# Patient Record
Sex: Female | Born: 1945 | Hispanic: Yes | Marital: Married | Smoking: Never smoker
Health system: Southern US, Community
[De-identification: ages and names within clinical notes are randomized; demographics above are authoritative.]

## PROBLEM LIST (undated history)

## (undated) DIAGNOSIS — R7302 Impaired glucose tolerance (oral): Secondary | ICD-10-CM

## (undated) DIAGNOSIS — R059 Cough, unspecified: Secondary | ICD-10-CM

## (undated) DIAGNOSIS — R413 Other amnesia: Secondary | ICD-10-CM

## (undated) DIAGNOSIS — M810 Age-related osteoporosis without current pathological fracture: Secondary | ICD-10-CM

## (undated) DIAGNOSIS — Z9889 Other specified postprocedural states: Secondary | ICD-10-CM

## (undated) DIAGNOSIS — E785 Hyperlipidemia, unspecified: Secondary | ICD-10-CM

## (undated) DIAGNOSIS — M6530 Trigger finger, unspecified finger: Secondary | ICD-10-CM

## (undated) DIAGNOSIS — I10 Essential (primary) hypertension: Secondary | ICD-10-CM

## (undated) HISTORY — DX: Trigger finger, unspecified finger: M65.30

## (undated) HISTORY — DX: Impaired glucose tolerance (oral): R73.02

## (undated) HISTORY — DX: Hyperlipidemia, unspecified: E78.5

## (undated) HISTORY — DX: Cough, unspecified: R05.9

## (undated) HISTORY — DX: Other amnesia: R41.3

## (undated) HISTORY — PX: HAND SURGERY: SHX662

## (undated) HISTORY — PX: HIP SURGERY: SHX245

## (undated) HISTORY — PX: KNEE SURGERY: SHX244

## (undated) HISTORY — DX: Age-related osteoporosis without current pathological fracture: M81.0

---

## 2018-02-27 ENCOUNTER — Ambulatory Visit
Admission: RE | Admit: 2018-02-27 | Discharge: 2018-02-27 | Disposition: A | Discharge status: Home / Self Care | Payer: No Typology Code available for payment source | Source: Ambulatory Visit | Attending: Family Medicine | Admitting: Family Medicine

## 2018-02-27 ENCOUNTER — Other Ambulatory Visit: Payer: Self-pay | Admitting: Family Medicine

## 2018-02-27 DIAGNOSIS — M25552 Pain in left hip: Secondary | ICD-10-CM

## 2018-03-06 ENCOUNTER — Emergency Department (HOSPITAL_COMMUNITY): Payer: No Typology Code available for payment source

## 2018-03-06 ENCOUNTER — Emergency Department (HOSPITAL_COMMUNITY)
Admission: EM | Admit: 2018-03-06 | Discharge: 2018-03-06 | Disposition: A | Discharge status: Home / Self Care | Payer: No Typology Code available for payment source | Attending: Emergency Medicine | Admitting: Emergency Medicine

## 2018-03-06 ENCOUNTER — Encounter (HOSPITAL_COMMUNITY): Payer: Self-pay

## 2018-03-06 ENCOUNTER — Other Ambulatory Visit: Payer: Self-pay

## 2018-03-06 ENCOUNTER — Other Ambulatory Visit: Payer: Self-pay | Admitting: Family Medicine

## 2018-03-06 ENCOUNTER — Ambulatory Visit
Admission: RE | Admit: 2018-03-06 | Discharge: 2018-03-06 | Disposition: A | Discharge status: Home / Self Care | Payer: No Typology Code available for payment source | Source: Ambulatory Visit | Attending: Family Medicine | Admitting: Family Medicine

## 2018-03-06 DIAGNOSIS — X58XXXA Exposure to other specified factors, initial encounter: Secondary | ICD-10-CM | POA: Insufficient documentation

## 2018-03-06 DIAGNOSIS — Y929 Unspecified place or not applicable: Secondary | ICD-10-CM | POA: Insufficient documentation

## 2018-03-06 DIAGNOSIS — M129 Arthropathy, unspecified: Secondary | ICD-10-CM

## 2018-03-06 DIAGNOSIS — I10 Essential (primary) hypertension: Secondary | ICD-10-CM | POA: Insufficient documentation

## 2018-03-06 DIAGNOSIS — Y999 Unspecified external cause status: Secondary | ICD-10-CM | POA: Insufficient documentation

## 2018-03-06 DIAGNOSIS — Y939 Activity, unspecified: Secondary | ICD-10-CM | POA: Insufficient documentation

## 2018-03-06 DIAGNOSIS — S73002A Unspecified subluxation of left hip, initial encounter: Secondary | ICD-10-CM | POA: Insufficient documentation

## 2018-03-06 DIAGNOSIS — T148XXA Other injury of unspecified body region, initial encounter: Secondary | ICD-10-CM

## 2018-03-06 HISTORY — DX: Essential (primary) hypertension: I10

## 2018-03-06 HISTORY — DX: Other specified postprocedural states: Z98.890

## 2018-03-06 LAB — CBC WITH DIFFERENTIAL/PLATELET
Abs Immature Granulocytes: 0.04 10*3/uL (ref 0.00–0.07)
Basophils Absolute: 0 10*3/uL (ref 0.0–0.1)
Basophils Relative: 0 %
EOS PCT: 1 %
Eosinophils Absolute: 0.1 10*3/uL (ref 0.0–0.5)
HCT: 42.5 % (ref 36.0–46.0)
Hemoglobin: 13.4 g/dL (ref 12.0–15.0)
Immature Granulocytes: 1 %
LYMPHS ABS: 3.5 10*3/uL (ref 0.7–4.0)
Lymphocytes Relative: 44 %
MCH: 26 pg (ref 26.0–34.0)
MCHC: 31.5 g/dL (ref 30.0–36.0)
MCV: 82.4 fL (ref 80.0–100.0)
Monocytes Absolute: 0.6 10*3/uL (ref 0.1–1.0)
Monocytes Relative: 8 %
NRBC: 0 % (ref 0.0–0.2)
Neutro Abs: 3.6 10*3/uL (ref 1.7–7.7)
Neutrophils Relative %: 46 %
Platelets: 325 10*3/uL (ref 150–400)
RBC: 5.16 MIL/uL — ABNORMAL HIGH (ref 3.87–5.11)
RDW: 15.5 % (ref 11.5–15.5)
WBC: 7.9 10*3/uL (ref 4.0–10.5)

## 2018-03-06 LAB — BASIC METABOLIC PANEL
Anion gap: 11 (ref 5–15)
BUN: 22 mg/dL (ref 8–23)
CO2: 25 mmol/L (ref 22–32)
Calcium: 8.9 mg/dL (ref 8.9–10.3)
Chloride: 101 mmol/L (ref 98–111)
Creatinine, Ser: 0.73 mg/dL (ref 0.44–1.00)
GFR calc Af Amer: >60 mL/min (ref >60)
GFR calc non Af Amer: >60 mL/min (ref >60)
Glucose, Bld: 93 mg/dL (ref 70–99)
Potassium: 3.9 mmol/L (ref 3.5–5.1)
Sodium: 137 mmol/L (ref 135–145)

## 2018-03-06 MED ORDER — PROPOFOL 10 MG/ML IV BOLUS
INTRAVENOUS | Status: AC | PRN
  Administered 2018-03-06: 50 mg via INTRAVENOUS

## 2018-03-06 MED ORDER — PROPOFOL 10 MG/ML IV BOLUS
INTRAVENOUS | Status: AC | PRN
  Administered 2018-03-06: 30 mg via INTRAVENOUS

## 2018-03-06 MED ORDER — HYDROMORPHONE HCL 1 MG/ML IJ SOLN
1.0000 mg | Freq: Once | INTRAMUSCULAR | Status: AC
  Administered 2018-03-06: 1 mg via INTRAVENOUS
  Filled 2018-03-06: qty 1

## 2018-03-06 MED ORDER — PROPOFOL 10 MG/ML IV BOLUS
INTRAVENOUS | Status: AC | PRN
  Administered 2018-03-06: 70 mg via INTRAVENOUS

## 2018-03-06 MED ORDER — PROPOFOL 10 MG/ML IV BOLUS
200.0000 mg | Freq: Once | INTRAVENOUS | Status: DC
  Filled 2018-03-06: qty 20

## 2018-03-06 NOTE — Discharge Instructions (Signed)
Continue your pain meds as prescribed by your doctor.   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 Please call [facility] today to schedule appointment. You likely will need revision surgery   Return to ER if you have worse hip pain, weakness, numbness

## 2018-03-06 NOTE — ED Notes (Addendum)
30 mg IV propofol administered by Dr Silverio LayYao

## 2018-03-06 NOTE — ED Triage Notes (Signed)
Pt is accompanied by son in law and husband. Pt coming from PCP, with imaging indicating dislocated left hip. Pt denies injury or fall. Pt has been doing a "treatment for a month" without improvement in pain. Pt is Spanish speaking, interpreter used [place] [phone]

## 2018-03-06 NOTE — Sedation Documentation (Signed)
Patient oxygen saturations dropped. Patient oxygenated using BMV and jaw thrust

## 2018-03-06 NOTE — ED Notes (Signed)
Per RN at PCP-patient had her [place] hip [place] and right hip replaced in [place]-states had imaging done on left hip which shows it is dislocated-states it needs surgery

## 2018-03-06 NOTE — Sedation Documentation (Signed)
X-ray at bedside

## 2018-03-06 NOTE — Sedation Documentation (Signed)
Reduction was not successful, per xray. Dr. Silverio LayYao to call orthopedic surgeon for consult. Patient and family updated on POC

## 2018-03-06 NOTE — ED Notes (Signed)
ED TO INPATIENT HANDOFF REPORT  Name/Age/Gender Raven Curry 72 y.o. female  Code Status   Home/SNF/Other Home  Chief Complaint left hip dislocation  Level of Care/Admitting Diagnosis ED Disposition    ED Disposition Condition Comment   Discharge  The patient appears reasonably screened and/or stabilized for discharge and I doubt any other medical condition or other [facility] requiring further screening, evaluation, or treatment in the ED exists or is present at this time prior to discharge.       Medical History Past Medical History:  Diagnosis Date  . History of hip surgery   . Hypertension     Allergies No Known Allergies  IV Location/Drains/Wounds Patient Lines/Drains/Airways Status   Active Line/Drains/Airways    None          Labs/Imaging Results for orders placed or performed during the hospital encounter of 03/06/18 (from the past 48 hour(s))  CBC with Differential/Platelet     Status: Abnormal   Collection Time: 03/06/18 12:19 PM  Result Value Ref Range   WBC 7.9 4.0 - 10.5 K/uL   RBC 5.16 (H) 3.87 - 5.11 MIL/uL   Hemoglobin 13.4 12.0 - 15.0 g/dL   HCT 45.4 09.8 - 11.9 %   MCV 82.4 80.0 - 100.0 fL   MCH 26.0 26.0 - 34.0 pg   MCHC 31.5 30.0 - 36.0 g/dL   RDW 14.7 82.9 - 56.2 %   Platelets 325 150 - 400 K/uL   nRBC 0.0 0.0 - 0.2 %   Neutrophils Relative % 46 %   Neutro Abs 3.6 1.7 - 7.7 K/uL   Lymphocytes Relative 44 %   Lymphs Abs 3.5 0.7 - 4.0 K/uL   Monocytes Relative 8 %   Monocytes Absolute 0.6 0.1 - 1.0 K/uL   Eosinophils Relative 1 %   Eosinophils Absolute 0.1 0.0 - 0.5 K/uL   Basophils Relative 0 %   Basophils Absolute 0.0 0.0 - 0.1 K/uL   Immature Granulocytes 1 %   Abs Immature Granulocytes 0.04 0.00 - 0.07 K/uL    Comment: Performed at [facility], [address], [place], [place] [address]  Basic metabolic panel     Status: None   Collection Time: 03/06/18 12:19 PM  Result Value Ref Range   Sodium 137  135 - 145 mmol/L   Potassium 3.9 3.5 - 5.1 mmol/L   Chloride 101 98 - 111 mmol/L   CO2 25 22 - 32 mmol/L   Glucose, Bld 93 70 - 99 mg/dL   BUN 22 8 - 23 mg/dL   Creatinine, Ser 5.78 0.44 - 1.00 mg/dL   Calcium 8.9 8.9 - 46.9 mg/dL   GFR calc non Af Amer >60 >60 mL/min   GFR calc Af Amer >60 >60 mL/min   Anion gap 11 5 - 15    Comment: Performed at [facility], [address], [place], [place] [address]   Ct Hip Left Wo Contrast  Result Date: 03/06/2018 CLINICAL DATA:  Possible dislocated left hip replacement. Left hip pain. The patient denies injury. EXAM: CT OF THE LEFT HIP WITHOUT CONTRAST TECHNIQUE: Multidetector CT imaging of the left hip was performed according to the standard protocol. Multiplanar CT image reconstructions were also generated. COMPARISON:  Plain films left hip earlier today and 02/27/2018. 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 The device is not dislocated. There is extensive erosive change in the left acetabulum. The medial wall of the acetabulum is markedly thinned and there  is extensive lucency at the bone-cement interface of the acetabular component. The superior aspect of the acetabular cup is completely eroded and the patient's left femoral head is superiorly and laterally subluxed. The anterior 70% of the greater trochanter is also eroded. No fracture is identified. Ligaments Suboptimally assessed by CT. Muscles and Tendons Mild-to-moderate fatty atrophy of the left gluteus minimus and maximus is present. 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 Multiple tiny bone fragments are identified in this lesion. There is fluid in the left trochanteric bursa. IMPRESSION: Negative for hip dislocation. 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 The femoral head is superiorly displaced and demonstrates mild lateral subluxation due to erosion of both the left acetabulum and the acetabular cup. Fluid in the left trochanteric bursa and pseudotumor in the site of erosion of the greater trochanter noted. Electronically Signed   By: Drusilla Kannerhomas  Dalessio M.D.   On: 03/06/2018 14:18   Dg Hip Unilat With Pelvis 2-3 Views Left  Result Date: 03/06/2018 CLINICAL DATA:  Post reduction EXAM: DG HIP (WITH OR WITHOUT PELVIS) 2-3V LEFT COMPARISON:  03/06/2018 FINDINGS: Unchanged abnormal alignment of the left hip, although not definitely dislocated, with location unchanged from prior 2 studies. The high femoral head positioning may reflect acetabular prosthesis wear with superior migration. No periprosthetic fracture is seen. Osteopenia. IMPRESSION: Unchanged abnormal left hip prosthesis alignment, although not clearly dislocated. Positioning may be related to failure of the acetabular component with femoral head migration or pseudoarthrosis. Consider CT characterization if there are no outside comparisons. Electronically Signed   By: Marnee SpringJonathon  Watts M.D.   On: 03/06/2018 13:05   Dg Hip Unilat With Pelvis 2-3 Views Left  Result Date: 03/06/2018 CLINICAL DATA:  Acute left hip pain EXAM: DG HIP (WITH OR WITHOUT PELVIS) 2-3V LEFT COMPARISON:  02/27/2018 FINDINGS: The left hip prosthesis is again dislocated superiorly. These changes are similar to that seen on the prior examination. IMPRESSION: Superior dislocation of the femoral prosthesis with respect to the acetabulum. Electronically Signed   By: Alcide CleverMark  Lukens M.D.   On: 03/06/2018 11:04   None  Pending Labs Unresulted Labs (From admission, onward)   None      Vitals/Pain Today's Vitals   03/06/18 1445 03/06/18 1512 03/06/18 1520 03/06/18 1522  BP: 127/71 (!) 152/83  (!) 152/83  Pulse: 68 64  64  Resp: (!) 21 13  13   Temp:    98.4 F (36.9 C)  TempSrc:    Oral  SpO2:  90% 98%  98%  Weight:      Height:      PainSc:   0-No pain     Isolation Precautions No active isolations  Medications Medications  propofol (DIPRIVAN) 10 mg/mL bolus/IV push 200 mg (150 mg Intravenous See Procedure Record 03/06/18 1455)  HYDROmorphone (DILAUDID) injection 1 mg (1 mg Intravenous Given 03/06/18 1209)  propofol (DIPRIVAN) 10 mg/mL bolus/IV push (50 mg Intravenous Given 03/06/18 1232)  propofol (DIPRIVAN) 10 mg/mL bolus/IV push (30 mg Intravenous Given 03/06/18 1233)  propofol (DIPRIVAN) 10 mg/mL bolus/IV push (70 mg Intravenous Given 03/06/18 1235)    Mobility walks with person assist

## 2018-03-06 NOTE — ED Notes (Signed)
Consents completed and signed using Video interpreter. Time out completed. Dr Silverio LayYao, RT Manus Gunningee, Kelsey PA, Ortho tech Jonny RuizJohn at bedside.

## 2018-03-06 NOTE — ED Provider Notes (Signed)
[facility]-EMERGENCY DEPT Provider Note   CSN: [id number] Arrival date & time: 03/06/18  1121     History   Chief Complaint Chief Complaint  Patient presents with  . Hip Pain    HPI Caryle ura yingling is a 72 y.o. female history of hypertension, previous hip surgery in [place] here presenting with left hip pain possible dislocation.  Patient states that she has been having left hip pain for the last several months.  Patient has been doing physical therapy for the last several months with no relief.  She apparently had an x-ray about a week ago that was read as normal.  Patient had increasing pain so had an x-ray today that showed a dislocation.  On my read, patient appeared to have possible dislocation the previous x-ray.  Patient new falls or injuries.  Patient states that she is wheelchair-bound for the last several months. Last meal was 8 am this morning.    The history is provided by the patient.    Past Medical History:  Diagnosis Date  . History of hip surgery   . Hypertension     There are no active problems to display for this patient.   Past Surgical History:  Procedure Laterality Date  . HAND SURGERY Right   . HIP SURGERY Bilateral   . KNEE SURGERY Bilateral      OB History   No obstetric history on file.      Home Medications    Prior to Admission medications   Not on File    Family History No family history on file.  Social History Social History   Tobacco Use  . Smoking status: Never Smoker  . Smokeless tobacco: Never Used  Substance Use Topics  . Alcohol use: Never    Frequency: Never  . Drug use: Never     Allergies   Patient has no known allergies.   Review of Systems Review of Systems  Musculoskeletal:       L hip pain   All other systems reviewed and are negative.    Physical Exam Updated Vital Signs BP (!) 128/59   Pulse 76   Temp 98.5 F (36.9 C) (Oral)   Resp 15   Ht 4' 10.66" (1.49 m)    Wt 68.8 kg   SpO2 97%   BMI 30.99 kg/m   Physical Exam Vitals signs and nursing note reviewed.  Constitutional:      Appearance: Normal appearance.  HENT:     Head: Normocephalic.     Nose: Nose normal.     Mouth/Throat:     Mouth: Mucous membranes are moist.  Eyes:     Pupils: Pupils are equal, round, and reactive to light.  Neck:     Musculoskeletal: Normal range of motion.  Cardiovascular:     Rate and Rhythm: Normal rate.  Pulmonary:     Effort: Pulmonary effort is normal.     Breath sounds: Normal breath sounds.  Abdominal:     General: Abdomen is flat.     Palpations: Abdomen is soft.  Musculoskeletal:     Comments: Dec ROM L hip, no obvious shortening. 2+ DP pulses, able to wiggle toes   Skin:    General: Skin is warm.     Capillary Refill: Capillary refill takes less than 2 seconds.  Neurological:     General: No focal deficit present.     Mental Status: She is alert.  Psychiatric:  Mood and Affect: Mood normal.      ED Treatments / Results  Labs (all labs ordered are listed, but only abnormal results are displayed) Labs Reviewed  CBC WITH DIFFERENTIAL/PLATELET - Abnormal; Notable for the following components:      Result Value   RBC 5.16 (*)    All other components within normal limits  BASIC METABOLIC PANEL    EKG None  Radiology Dg Hip Unilat With Pelvis 2-3 Views Left  Result Date: 03/06/2018 CLINICAL DATA:  Post reduction EXAM: DG HIP (WITH OR WITHOUT PELVIS) 2-3V LEFT COMPARISON:  03/06/2018 FINDINGS: Unchanged abnormal alignment of the left hip, although not definitely dislocated, with location unchanged from prior 2 studies. The high femoral head positioning may reflect acetabular prosthesis wear with superior migration. No periprosthetic fracture is seen. Osteopenia. IMPRESSION: Unchanged abnormal left hip prosthesis alignment, although not clearly dislocated. Positioning may be related to failure of the acetabular component with  femoral head migration or pseudoarthrosis. Consider CT characterization if there are no outside comparisons. Electronically Signed   By: Marnee SpringJonathon  Watts M.D.   On: 03/06/2018 13:05   Dg Hip Unilat With Pelvis 2-3 Views Left  Result Date: 03/06/2018 CLINICAL DATA:  Acute left hip pain EXAM: DG HIP (WITH OR WITHOUT PELVIS) 2-3V LEFT COMPARISON:  02/27/2018 FINDINGS: The left hip prosthesis is again dislocated superiorly. These changes are similar to that seen on the prior examination. IMPRESSION: Superior dislocation of the femoral prosthesis with respect to the acetabulum. Electronically Signed   By: Alcide CleverMark  Lukens M.D.   On: 03/06/2018 11:04    Procedures .Sedation Date/Time: 03/06/2018 1:18 PM Performed by: Charlynne PanderYao, Thuy Atilano Hsienta, MD Authorized by: Charlynne PanderYao, Yeslin Delio Hsienta, MD   Consent:    Consent obtained:  Verbal   Consent given by:  Patient   Risks discussed:  Allergic reaction, dysrhythmia, inadequate sedation, nausea, prolonged hypoxia resulting in organ damage, prolonged sedation necessitating reversal, respiratory compromise necessitating ventilatory assistance and intubation and vomiting   Alternatives discussed:  Analgesia without sedation, anxiolysis and regional anesthesia Universal protocol:    Procedure explained and questions answered to patient or proxy's satisfaction: yes     Relevant documents present and verified: yes     Test results available and properly labeled: yes     Imaging studies available: yes     Required blood products, implants, devices, and special equipment available: yes     Site/side marked: yes     Immediately prior to procedure a time out was called: yes     Patient identity confirmation method:  Verbally with patient Indications:    Procedure necessitating sedation performed by:  Physician performing sedation Pre-sedation assessment:    Time since last food or drink:  4 hours   ASA classification: class 1 - normal, healthy patient     Neck mobility:  normal     Mouth opening:  3 or more finger widths   Thyromental distance:  4 finger widths   Mallampati score:  I - soft palate, uvula, fauces, pillars visible   Pre-sedation assessments completed and reviewed: airway patency, cardiovascular function, hydration status, mental status, nausea/vomiting, pain level, respiratory function and temperature   Immediate pre-procedure details:    Reassessment: Patient reassessed immediately prior to procedure     Reviewed: vital signs, relevant labs/tests and NPO status     Verified: bag valve mask available, emergency equipment available, intubation equipment available, IV patency confirmed, oxygen available and suction available   Procedure details (see MAR for exact dosages):  Preoxygenation:  Nasal cannula   Sedation:  Propofol   Intra-procedure monitoring:  Blood pressure monitoring, cardiac monitor, continuous pulse oximetry, frequent LOC assessments, frequent vital sign checks and continuous capnometry   Intra-procedure events: none     Total Provider sedation time (minutes):  30 Post-procedure details:    Attendance: Constant attendance by certified staff until patient recovered     Recovery: Patient returned to pre-procedure baseline     Post-sedation assessments completed and reviewed: airway patency, cardiovascular function, hydration status, mental status, nausea/vomiting, pain level, respiratory function and temperature     Patient is stable for discharge or admission: yes     Patient tolerance:  Tolerated well, no immediate complications Reduction of dislocation Date/Time: 03/06/2018 1:19 PM Performed by: Charlynne Pander, MD Authorized by: Charlynne Pander, MD  Consent: Verbal consent obtained. Risks and benefits: risks, benefits and alternatives were discussed Consent given by: patient Patient understanding: patient states understanding of the procedure being performed Patient consent: the patient's understanding of the  procedure matches consent given Procedure consent: procedure consent matches procedure scheduled Relevant documents: relevant documents present and verified Test results: test results available and properly labeled Site marked: the operative site was marked Imaging studies: imaging studies available Patient identity confirmed: verbally with patient and arm band Time out: Immediately prior to procedure a "time out" was called to verify the correct patient, procedure, equipment, support staff and site/side marked as required. Local anesthesia used: no  Anesthesia: Local anesthesia used: no  Sedation: Patient sedated: yes Sedatives: propofol  Patient tolerance: Patient tolerated the procedure well with no immediate complications    (including critical care time)  Medications Ordered in ED Medications  propofol (DIPRIVAN) 10 mg/mL bolus/IV push 200 mg (has no administration in time range)  HYDROmorphone (DILAUDID) injection 1 mg (1 mg Intravenous Given 03/06/18 1209)  propofol (DIPRIVAN) 10 mg/mL bolus/IV push (50 mcg Intravenous Given 03/06/18 1232)  propofol (DIPRIVAN) 10 mg/mL bolus/IV push (30 mcg Intravenous Given 03/06/18 1233)  propofol (DIPRIVAN) 10 mg/mL bolus/IV push (70 mcg Intravenous Given 03/06/18 1235)     Initial Impression / Assessment and Plan / ED Course  I have reviewed the triage vital signs and the nursing notes.  Pertinent labs & imaging results that were available during my care of the patient were reviewed by me and considered in my medical decision making (see chart for details).    Kimberla micheala morissette is a 72 y.o. female here with L hip pain. Outpatient xray showed possible dislocation. I am concerned that she may have dislocated for months. I talked to Dr. Susa Simmonds from ortho, who recommend bedside conscious sedation and attempt at reduction. He will look at the xrays.   1 pm I attempted to reduce the dislocation but was unsuccessful. Patient briefly  became hypoxic and hypotensive and was give IVF and bagged briefly and now awake. I called Dr. Susa Simmonds again, who states that she may have hardware that was eroding through the acetabulum. Recommend noncontrast CT hip and follow up for hip revision.   3:16 PM Xray showed no hip dislocation. But there is lateral subluxation and erosion of acetabulum and the cup. She is on oxycodone at home already and has wheelchair. Will have her follow up with [facility] to schedule revision surgery.   Final Clinical Impressions(s) / ED Diagnoses   Final diagnoses:  Dislocation closed    ED Discharge Orders    None       Charlynne Pander, MD  03/06/18 1517  

## 2018-03-06 NOTE — ED Notes (Addendum)
50 mg IV propofol administered by Dr Silverio LayYao

## 2020-05-16 IMAGING — CT CT HIP*L* W/O CM
2 of 4 series · 17 of 46 positions shown, 19 images · non-contrast
Comparison: Plain films left hip earlier today and 02/27/2018.

CLINICAL DATA: Possible dislocated left hip replacement. Left hip
pain. The patient denies injury.

EXAM:
CT OF THE LEFT HIP WITHOUT CONTRAST
TECHNIQUE: Multidetector CT imaging of the left hip was performed according to
the standard protocol. Multiplanar CT image reconstructions were
also generated.

[Series 4: axial (person_name) (person_name) · axial · 0.46mm/px · z∈[-167,+21]mm · 14 of 110 slices shown, 16 images]
[im 8/110  soft-tissue]
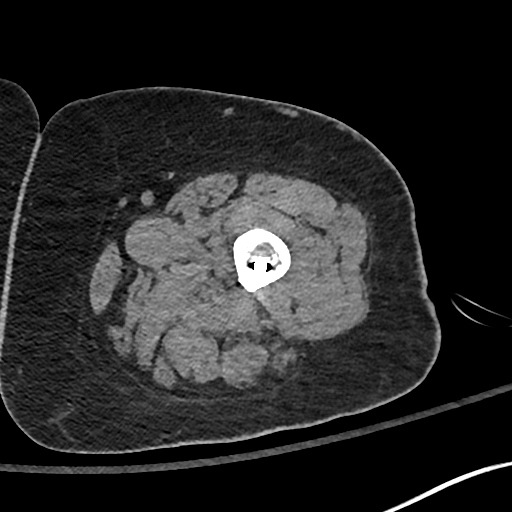
[im 8/110  bone]
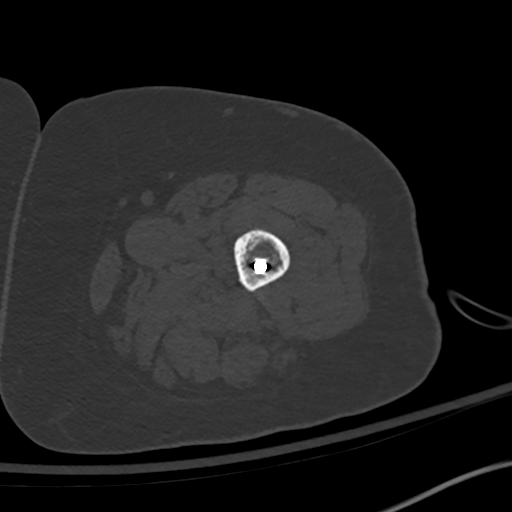
[im 15/110  soft-tissue]
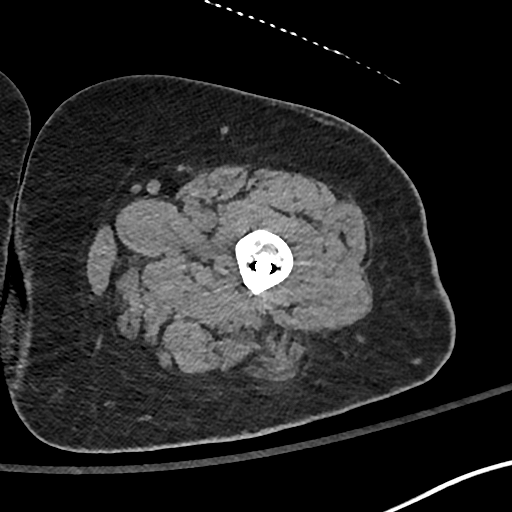
[im 22/110  soft-tissue]
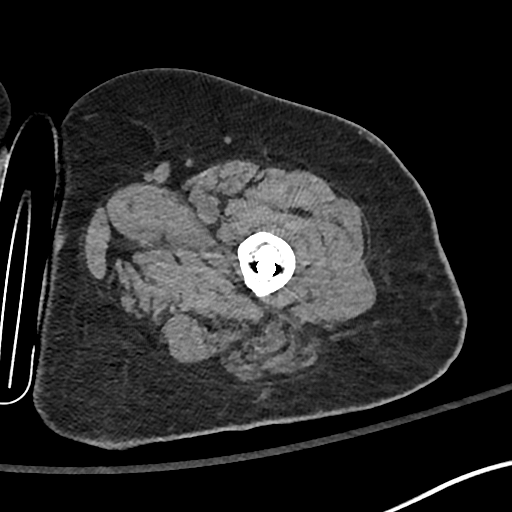
[im 30/110  soft-tissue]
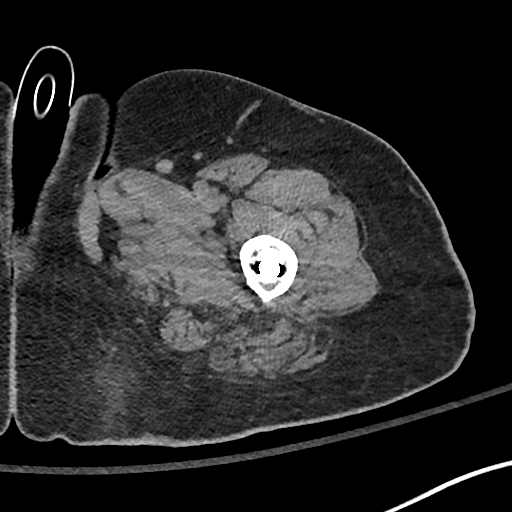
[im 37/110  soft-tissue]
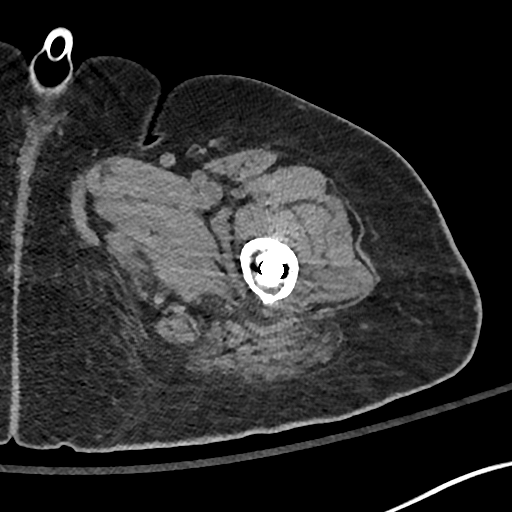
[im 44/110  soft-tissue]
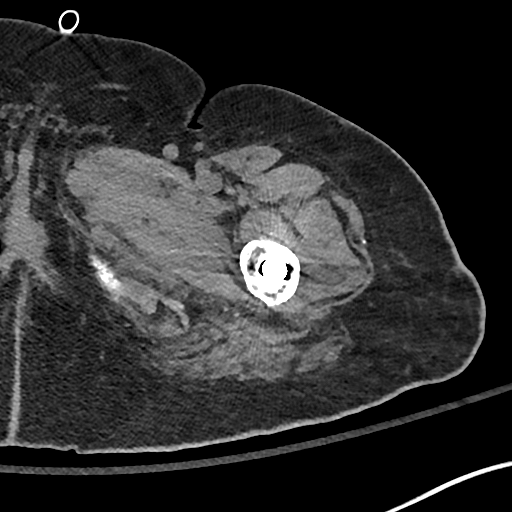
[im 51/110  soft-tissue]
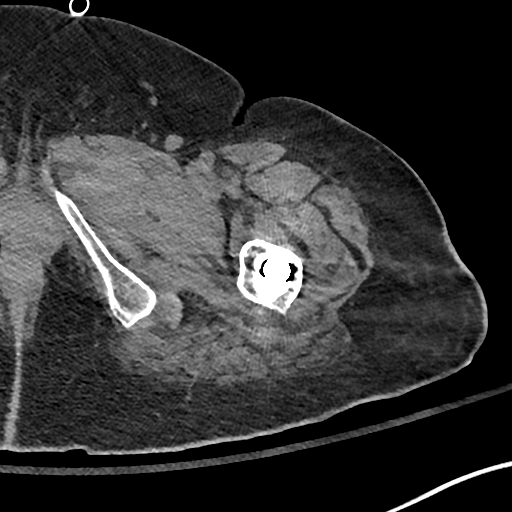
[im 59/110  soft-tissue]
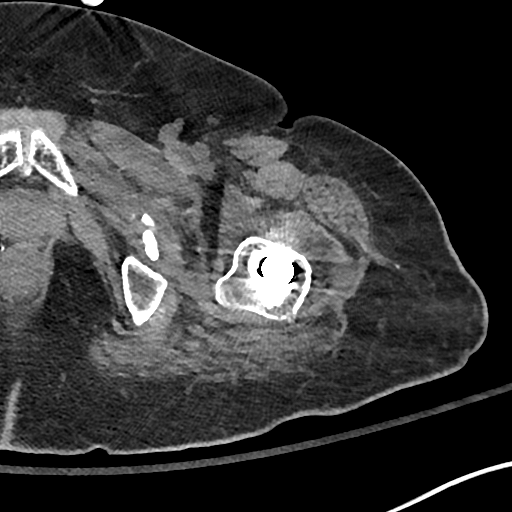
[im 66/110  soft-tissue]
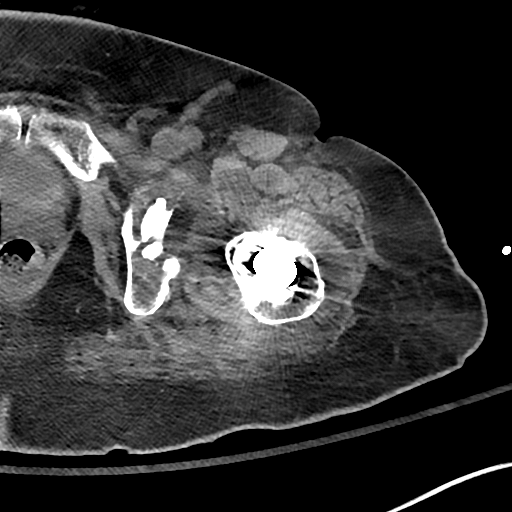
[im 66/110  bone]
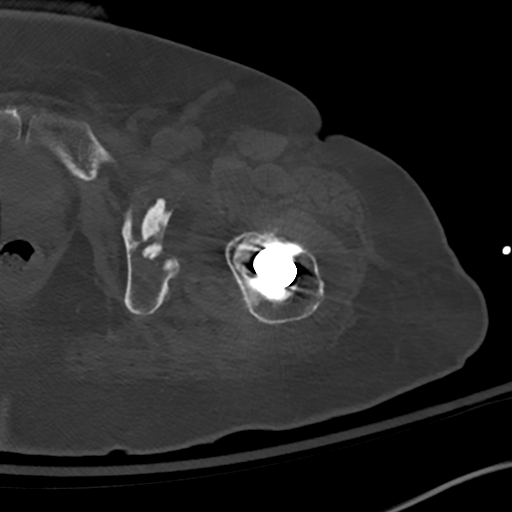
[im 73/110  soft-tissue]
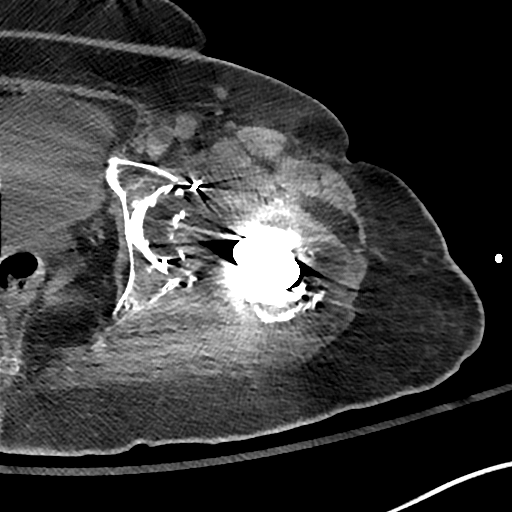
[im 80/110  soft-tissue]
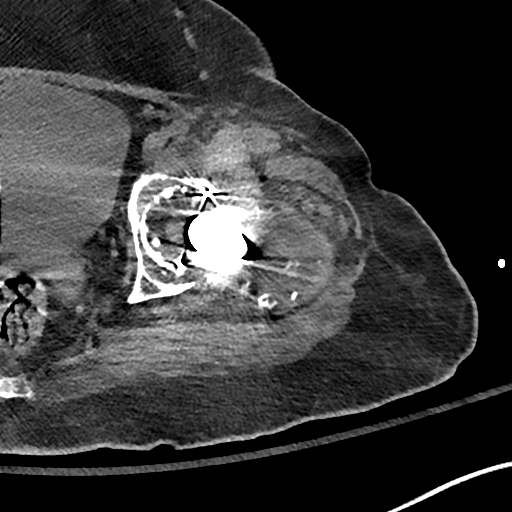
[im 88/110  soft-tissue]
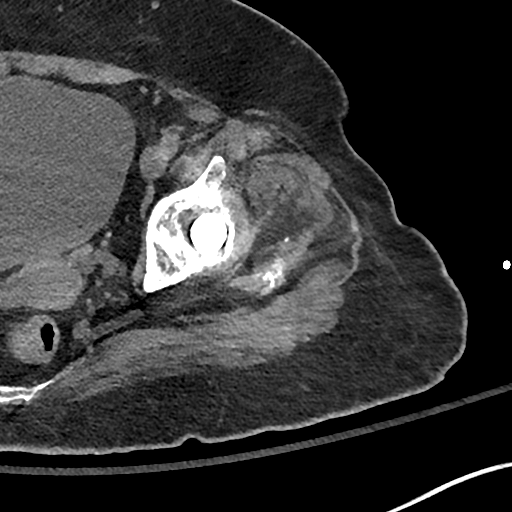
[im 95/110  soft-tissue]
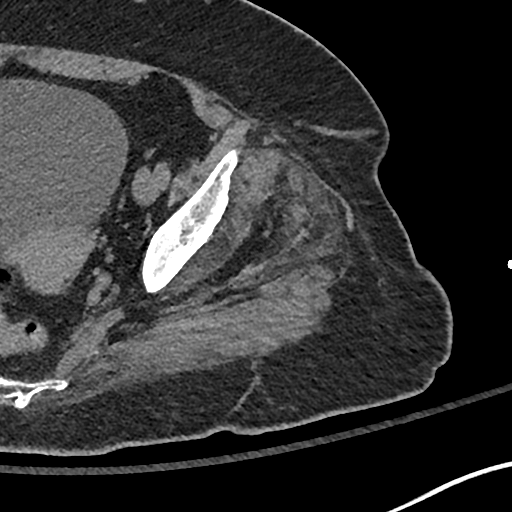
[im 102/110  soft-tissue]
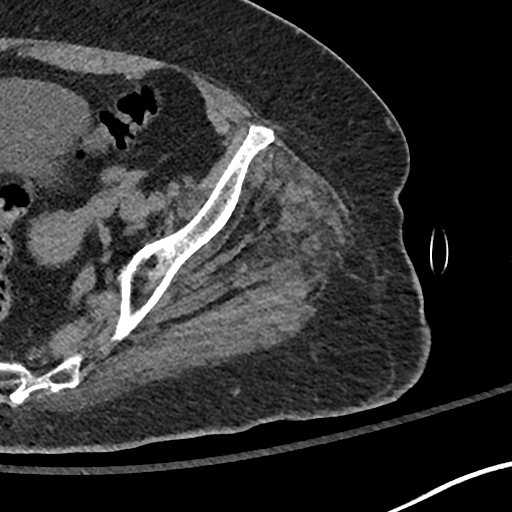

[Series 9: coronal st · coronal · 0.43mm/px · 3 of 89 slices shown]
[im 30/89  soft-tissue]
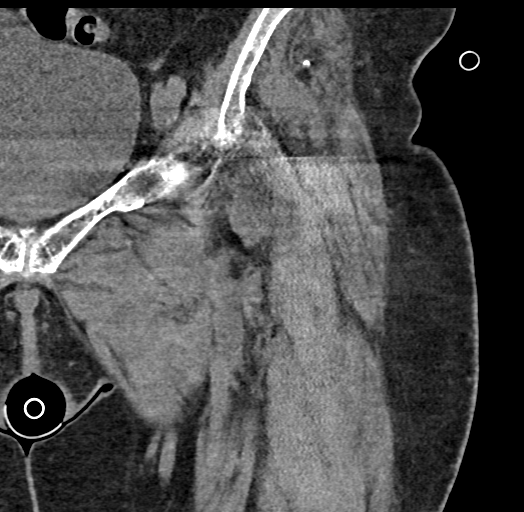
[im 40/89  soft-tissue]
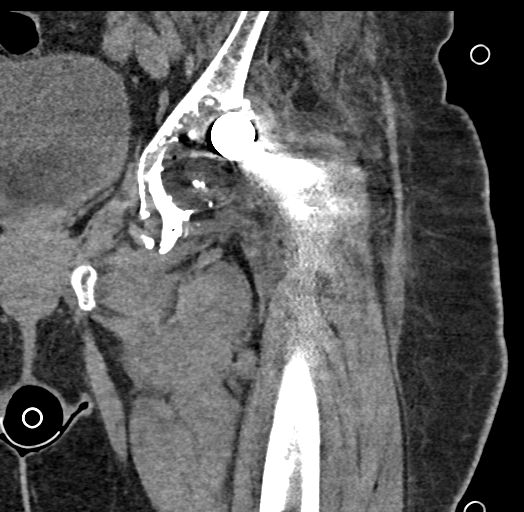
[im 49/89  soft-tissue]
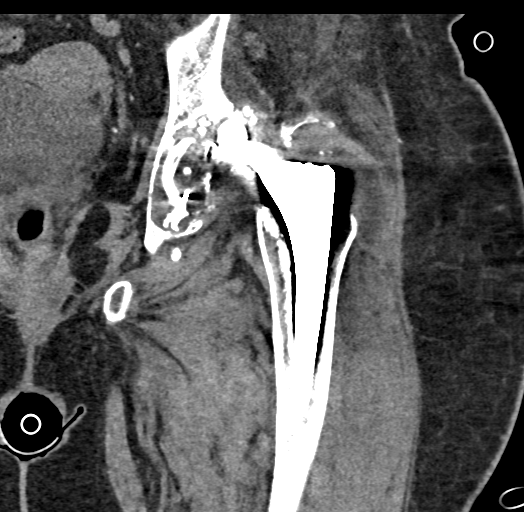

[17 of 46 positions shown; findings below may reference images not displayed]

FINDINGS: Bones/Joint/Cartilage

Left hip arthroplasty is in place. The device is not dislocated.
There is extensive erosive change in the left acetabulum. The medial
wall of the acetabulum is markedly thinned and there is extensive
lucency at the bone-cement interface of the acetabular component.
The superior aspect of the acetabular cup is completely eroded and
the patient's left femoral head is superiorly and laterally
subluxed. The anterior 70% of the greater trochanter is also eroded.
No fracture is identified.

Ligaments

Suboptimally assessed by CT.

Muscles and Tendons

Mild-to-moderate fatty atrophy of the left gluteus minimus and
maximus is present.

Soft tissues

A round lesion measuring 3 cm AP x 3.2 cm transverse x 2 cm
craniocaudal at the site of erosion in the greater trochanter is
consistent with a pseudotumor related to particle disease. Multiple
tiny bone fragments are identified in this lesion. There is fluid in
the left trochanteric bursa.
IMPRESSION: Negative for hip dislocation.

Extensive erosive change about the acetabular component and greater
trochanter is consistent with particle disease. The femoral head is
superiorly displaced and demonstrates mild lateral subluxation due
to erosion of both the left acetabulum and the acetabular cup. Fluid
in the left trochanteric bursa and pseudotumor in the site of
erosion of the greater trochanter noted.

## 2022-02-28 ENCOUNTER — Other Ambulatory Visit: Payer: Self-pay

## 2022-02-28 ENCOUNTER — Emergency Department (HOSPITAL_COMMUNITY): Payer: No Typology Code available for payment source

## 2022-02-28 ENCOUNTER — Encounter (HOSPITAL_COMMUNITY): Payer: Self-pay

## 2022-02-28 ENCOUNTER — Emergency Department (HOSPITAL_COMMUNITY)
Admission: EM | Admit: 2022-02-28 | Discharge: 2022-03-01 | Disposition: A | Discharge status: Home / Self Care | Payer: Self-pay | Attending: Emergency Medicine | Admitting: Emergency Medicine

## 2022-02-28 DIAGNOSIS — Z1152 Encounter for screening for COVID-19: Secondary | ICD-10-CM | POA: Insufficient documentation

## 2022-02-28 DIAGNOSIS — R1110 Vomiting, unspecified: Secondary | ICD-10-CM | POA: Insufficient documentation

## 2022-02-28 DIAGNOSIS — R052 Subacute cough: Secondary | ICD-10-CM | POA: Insufficient documentation

## 2022-02-28 LAB — CBC WITH DIFFERENTIAL/PLATELET
Abs Immature Granulocytes: 0.01 10*3/uL (ref 0.00–0.07)
Basophils Absolute: 0.1 10*3/uL (ref 0.0–0.1)
Basophils Relative: 1 %
Eosinophils Absolute: 0.4 10*3/uL (ref 0.0–0.5)
Eosinophils Relative: 7 %
HCT: 40.3 % (ref 36.0–46.0)
Hemoglobin: 12.9 g/dL (ref 12.0–15.0)
Immature Granulocytes: 0 %
Lymphocytes Relative: 36 %
Lymphs Abs: 2.3 10*3/uL (ref 0.7–4.0)
MCH: 27.2 pg (ref 26.0–34.0)
MCHC: 32 g/dL (ref 30.0–36.0)
MCV: 84.8 fL (ref 80.0–100.0)
Monocytes Absolute: 0.7 10*3/uL (ref 0.1–1.0)
Monocytes Relative: 10 %
Neutro Abs: 2.9 10*3/uL (ref 1.7–7.7)
Neutrophils Relative %: 46 %
Platelets: 262 10*3/uL (ref 150–400)
RBC: 4.75 MIL/uL (ref 3.87–5.11)
RDW: 15.2 % (ref 11.5–15.5)
WBC: 6.4 10*3/uL (ref 4.0–10.5)
nRBC: 0 % (ref 0.0–0.2)

## 2022-02-28 LAB — COMPREHENSIVE METABOLIC PANEL
ALT: 16 U/L (ref 0–44)
AST: 21 U/L (ref 15–41)
Albumin: 3.9 g/dL (ref 3.5–5.0)
Alkaline Phosphatase: 60 U/L (ref 38–126)
Anion gap: 7 (ref 5–15)
BUN: 19 mg/dL (ref 8–23)
CO2: 28 mmol/L (ref 22–32)
Calcium: 9.1 mg/dL (ref 8.9–10.3)
Chloride: 105 mmol/L (ref 98–111)
Creatinine, Ser: 0.76 mg/dL (ref 0.44–1.00)
GFR, Estimated: >60 mL/min (ref >60)
Glucose, Bld: 135 mg/dL — ABNORMAL HIGH (ref 70–99)
Potassium: 4.1 mmol/L (ref 3.5–5.1)
Sodium: 140 mmol/L (ref 135–145)
Total Bilirubin: 0.5 mg/dL (ref 0.3–1.2)
Total Protein: 7.1 g/dL (ref 6.5–8.1)

## 2022-02-28 LAB — RESP PANEL BY RT-PCR (FLU A&B, COVID) ARPGX2
Influenza A by PCR: NEGATIVE
Influenza B by PCR: NEGATIVE
SARS Coronavirus 2 by RT PCR: NEGATIVE

## 2022-02-28 NOTE — ED Provider Triage Note (Signed)
Emergency Medicine Provider Triage Evaluation Note  Raven Curry , a 76 y.o. female  was evaluated in triage.  Pt complains of cough for 6 weeks.    Review of Systems  Positive: Cough  Negative: Fever   Physical Exam  BP (!) 153/72 (BP Location: Right Arm)   Pulse 90   Temp 98.5 F (36.9 C) (Oral)   Resp 18   Ht 4\' 10"  (1.473 m)   Wt 65 kg   SpO2 96%   BMI 29.95 kg/m  Gen:   Awake, no distress   Resp:  Coughing, rhonchi and wheezing  MSK:   Moves extremities without difficulty  Other:    Medical Decision Making  Medically screening exam initiated at 9:41 PM.  Appropriate orders placed.  Raven Curry was informed that the remainder of the evaluation will be completed by another provider, this initial triage assessment does not replace that evaluation, and the importance of remaining in the ED until their evaluation is complete.     Estelle Grumbles, Elson Areas 02/28/22 2142

## 2022-02-28 NOTE — ED Notes (Signed)
Save blue tube in main lab °

## 2022-02-28 NOTE — ED Triage Notes (Signed)
Pt reports with coughing and vomiting x 6 weeks. Pt is coughing up clear mucus.

## 2022-03-01 MED ORDER — OMEPRAZOLE 20 MG PO CPDR: 20.0000 mg | DELAYED_RELEASE_CAPSULE | Freq: Every day | ORAL | 0 refills | Status: DC

## 2022-03-01 MED ORDER — DEXTROMETHORPHAN POLISTIREX ER 30 MG/5ML PO SUER: 15.0000 mg | Freq: Two times a day (BID) | ORAL | 0 refills | Status: DC

## 2022-03-01 MED ORDER — AZITHROMYCIN 250 MG PO TABS: 250.0000 mg | ORAL_TABLET | Freq: Every day | ORAL | 0 refills | Status: DC

## 2022-03-01 NOTE — ED Provider Notes (Signed)
[facility] Emergency Department Provider Note MRN:  [id number]  Arrival date & time: 03/01/22     Chief Complaint   Cough and Emesis   History of Present Illness   Raven Curry is a 76 y.o. year-old female presents to the ED with chief complaint of cough for the past 6 weeks.  She denies fevers or chills.  She states that sometimes she coughs so much that she vomits.  She does have history of acid reflux and had been taking omeprazole with good success, but she discontinued this in the past.  She states that she does have burning sensation in her throat.  She denies productive cough.  She has tried over-the-counter cough medicines, Tessalon Perles, and an inhaler without relief.  She has also tried over-the-counter antihistamines without relief.  History provided by patient.   Review of Systems  Pertinent positive and negative review of systems noted in HPI.    Physical Exam   Vitals:   02/28/22 2130 03/01/22 0042  BP: (!) 153/72 (!) 152/68  Pulse:  79  Resp: 18 16  Temp: 98.5 F (36.9 C) 97.8 F (36.6 C)  SpO2:  95%    CONSTITUTIONAL:  well-appearing, NAD NEURO:  Alert and oriented x 3, CN 3-12 grossly intact EYES:  eyes equal and reactive ENT/NECK:  Supple, no stridor  CARDIO:  normal rate, regular rhythm, appears well-perfused  PULM:  No respiratory distress, CTAB GI/GU:  non-distended, non-tender MSK/SPINE:  No gross deformities, no edema, moves all extremities  SKIN:  no rash, atraumatic   *Additional and/or pertinent findings included in MDM below  Diagnostic and Interventional Summary    EKG Interpretation  Date/Time:    Ventricular Rate:    PR Interval:    QRS Duration:   QT Interval:    QTC Calculation:   R Axis:     Text Interpretation:         Labs Reviewed  COMPREHENSIVE METABOLIC PANEL - Abnormal; Notable for the following components:      Result Value   Glucose, Bld 135 (*)    All other components within  normal limits  RESP PANEL BY RT-PCR (FLU A&B, COVID) [id number]  CBC WITH DIFFERENTIAL/PLATELET    DG Chest 2 View  Final Result      Medications - No data to display   Procedures  /  Critical Care Procedures  ED Course and Medical Decision Making  I have reviewed the triage vital signs, the nursing notes, and pertinent available records from the EMR.  Social Determinants Affecting Complexity of Care: Patient has no clinically significant social determinants affecting this chief complaint..   ED Course:    Medical Decision Making Patient here with chronic cough over the past 6 weeks.  She has not had fevers or chills.  She does have history of acid reflux, but is not being treated for this.  She does report burning in her throat sensation.  She has had posttussive emesis.  Laboratory workup in triage is reassuring.  Chest x-ray negative for pneumonia.  COVID, flu, are negative.  Discussed treatment options, will plan for trial of azithromycin given prolonged nature of the cough, but it is possible that this could be her reflux and will restart the omeprazole.  Will have her follow-up with her regular doctor.  Amount and/or Complexity of Data Reviewed Labs: ordered.    Details: No leukocytosis, no significant electrolyte derangement COVID and flu are negative Radiology: ordered and independent interpretation  performed.    Details: No pneumonia.  Risk OTC drugs. Prescription drug management.     Consultants: No consultations were needed in caring for this patient.   Treatment and Plan: Emergency department workup does not suggest an emergent condition requiring admission or immediate intervention beyond  what has been performed at this time. The patient is safe for discharge and has  been instructed to return immediately for worsening symptoms, change in  symptoms or any other concerns    Final Clinical Impressions(s) / ED Diagnoses     ICD-10-CM   1. Subacute cough   R05.2       ED Discharge Orders          Ordered    azithromycin (ZITHROMAX) 250 MG tablet  Daily        03/01/22 0106    dextromethorphan (DELSYM) 30 MG/5ML liquid  2 times daily        03/01/22 0106    omeprazole (PRILOSEC) 20 MG capsule  Daily        03/01/22 0106              Discharge Instructions Discussed with and Provided to Patient:   Discharge Instructions   None      Roxy Horseman, PA-C 03/01/22 0115    Zadie Rhine, MD 03/01/22 [phone]

## 2022-09-16 DIAGNOSIS — E559 Vitamin D deficiency, unspecified: Secondary | ICD-10-CM | POA: Diagnosis not present

## 2022-09-16 DIAGNOSIS — M79641 Pain in right hand: Secondary | ICD-10-CM | POA: Diagnosis not present

## 2022-09-16 DIAGNOSIS — Z0000 Encounter for general adult medical examination without abnormal findings: Secondary | ICD-10-CM | POA: Diagnosis not present

## 2022-09-16 DIAGNOSIS — E7800 Pure hypercholesterolemia, unspecified: Secondary | ICD-10-CM | POA: Diagnosis not present

## 2022-09-16 DIAGNOSIS — Z79899 Other long term (current) drug therapy: Secondary | ICD-10-CM | POA: Diagnosis not present

## 2022-09-16 DIAGNOSIS — R5383 Other fatigue: Secondary | ICD-10-CM | POA: Diagnosis not present

## 2022-09-16 DIAGNOSIS — R0602 Shortness of breath: Secondary | ICD-10-CM | POA: Diagnosis not present

## 2022-09-16 DIAGNOSIS — Z1159 Encounter for screening for other viral diseases: Secondary | ICD-10-CM | POA: Diagnosis not present

## 2022-09-16 DIAGNOSIS — Z131 Encounter for screening for diabetes mellitus: Secondary | ICD-10-CM | POA: Diagnosis not present

## 2022-09-16 DIAGNOSIS — I10 Essential (primary) hypertension: Secondary | ICD-10-CM | POA: Diagnosis not present

## 2022-09-19 DIAGNOSIS — Z7189 Other specified counseling: Secondary | ICD-10-CM | POA: Diagnosis not present

## 2022-09-23 DIAGNOSIS — E7800 Pure hypercholesterolemia, unspecified: Secondary | ICD-10-CM | POA: Diagnosis not present

## 2022-09-23 DIAGNOSIS — I10 Essential (primary) hypertension: Secondary | ICD-10-CM | POA: Diagnosis not present

## 2022-09-23 DIAGNOSIS — R7303 Prediabetes: Secondary | ICD-10-CM | POA: Diagnosis not present

## 2022-09-23 DIAGNOSIS — Z6830 Body mass index (BMI) 30.0-30.9, adult: Secondary | ICD-10-CM | POA: Diagnosis not present

## 2022-09-30 DIAGNOSIS — Z9109 Other allergy status, other than to drugs and biological substances: Secondary | ICD-10-CM | POA: Diagnosis not present

## 2022-09-30 DIAGNOSIS — R030 Elevated blood-pressure reading, without diagnosis of hypertension: Secondary | ICD-10-CM | POA: Diagnosis not present

## 2022-12-27 ENCOUNTER — Encounter: Payer: Self-pay | Admitting: Physician Assistant

## 2022-12-27 DIAGNOSIS — M79672 Pain in left foot: Secondary | ICD-10-CM | POA: Diagnosis not present

## 2022-12-27 DIAGNOSIS — I10 Essential (primary) hypertension: Secondary | ICD-10-CM | POA: Diagnosis not present

## 2022-12-27 DIAGNOSIS — E7800 Pure hypercholesterolemia, unspecified: Secondary | ICD-10-CM | POA: Diagnosis not present

## 2022-12-27 DIAGNOSIS — M79671 Pain in right foot: Secondary | ICD-10-CM | POA: Diagnosis not present

## 2022-12-27 DIAGNOSIS — Z780 Asymptomatic menopausal state: Secondary | ICD-10-CM | POA: Diagnosis not present

## 2022-12-27 DIAGNOSIS — Z9109 Other allergy status, other than to drugs and biological substances: Secondary | ICD-10-CM | POA: Diagnosis not present

## 2022-12-27 DIAGNOSIS — R413 Other amnesia: Secondary | ICD-10-CM | POA: Diagnosis not present

## 2022-12-27 DIAGNOSIS — K219 Gastro-esophageal reflux disease without esophagitis: Secondary | ICD-10-CM | POA: Diagnosis not present

## 2022-12-27 DIAGNOSIS — M1990 Unspecified osteoarthritis, unspecified site: Secondary | ICD-10-CM | POA: Diagnosis not present

## 2022-12-27 DIAGNOSIS — R7303 Prediabetes: Secondary | ICD-10-CM | POA: Diagnosis not present

## 2022-12-30 DIAGNOSIS — Z780 Asymptomatic menopausal state: Secondary | ICD-10-CM | POA: Diagnosis not present

## 2023-01-06 DIAGNOSIS — R059 Cough, unspecified: Secondary | ICD-10-CM | POA: Diagnosis not present

## 2023-01-06 DIAGNOSIS — Z780 Asymptomatic menopausal state: Secondary | ICD-10-CM | POA: Diagnosis not present

## 2023-01-06 DIAGNOSIS — K219 Gastro-esophageal reflux disease without esophagitis: Secondary | ICD-10-CM | POA: Diagnosis not present

## 2023-01-06 DIAGNOSIS — I10 Essential (primary) hypertension: Secondary | ICD-10-CM | POA: Diagnosis not present

## 2023-01-06 DIAGNOSIS — R413 Other amnesia: Secondary | ICD-10-CM | POA: Diagnosis not present

## 2023-01-06 DIAGNOSIS — Z9109 Other allergy status, other than to drugs and biological substances: Secondary | ICD-10-CM | POA: Diagnosis not present

## 2023-01-06 DIAGNOSIS — R0602 Shortness of breath: Secondary | ICD-10-CM | POA: Diagnosis not present

## 2023-01-06 DIAGNOSIS — E7800 Pure hypercholesterolemia, unspecified: Secondary | ICD-10-CM | POA: Diagnosis not present

## 2023-01-06 DIAGNOSIS — R7303 Prediabetes: Secondary | ICD-10-CM | POA: Diagnosis not present

## 2023-01-06 DIAGNOSIS — M1990 Unspecified osteoarthritis, unspecified site: Secondary | ICD-10-CM | POA: Diagnosis not present

## 2023-01-06 DIAGNOSIS — M810 Age-related osteoporosis without current pathological fracture: Secondary | ICD-10-CM | POA: Diagnosis not present

## 2023-01-17 DIAGNOSIS — M21622 Bunionette of left foot: Secondary | ICD-10-CM | POA: Diagnosis not present

## 2023-01-17 DIAGNOSIS — M21621 Bunionette of right foot: Secondary | ICD-10-CM | POA: Diagnosis not present

## 2023-01-17 DIAGNOSIS — M2012 Hallux valgus (acquired), left foot: Secondary | ICD-10-CM | POA: Diagnosis not present

## 2023-01-17 DIAGNOSIS — M2011 Hallux valgus (acquired), right foot: Secondary | ICD-10-CM | POA: Diagnosis not present

## 2023-01-24 ENCOUNTER — Encounter: Payer: Self-pay | Admitting: Physician Assistant

## 2023-01-24 ENCOUNTER — Other Ambulatory Visit (INDEPENDENT_AMBULATORY_CARE_PROVIDER_SITE_OTHER): Payer: Medicaid Other

## 2023-01-24 ENCOUNTER — Ambulatory Visit: Payer: No Typology Code available for payment source

## 2023-01-24 ENCOUNTER — Ambulatory Visit: Payer: Medicaid Other | Admitting: Physician Assistant

## 2023-01-24 VITALS — BP 151/73 | HR 71 | Resp 18 | Ht 62.0 in | Wt 137.0 lb

## 2023-01-24 DIAGNOSIS — R413 Other amnesia: Secondary | ICD-10-CM | POA: Diagnosis not present

## 2023-01-24 LAB — VITAMIN B12: Vitamin B-12: 457 pg/mL (ref 211–911)

## 2023-01-24 LAB — TSH: TSH: 1.44 u[IU]/mL (ref 0.35–5.50)

## 2023-01-24 MED ORDER — DONEPEZIL HCL 5 MG PO TABS: 5.0000 mg | ORAL_TABLET | Freq: Every day | ORAL | 11 refills | Status: DC

## 2023-01-24 NOTE — Progress Notes (Signed)
B12 and thyroid are normal, thanks

## 2023-01-24 NOTE — Progress Notes (Signed)
Assessment/Plan:     Raven Curry is a very pleasant 77 y.o. year old Spanish speaking  RH female with a history of hypertension, hyperlipidemia, prediabetes, GERD, arthritis seen today for evaluation of memory loss. MoCA today is 22/30.  Workup is in progress. Etiology unclear although suspect a component of isolation and depression affecting her memory issues. Patient is able to participate on his ADLs and to drive without difficulties    Memory Impairment  MRI brain without contrast to assess for underlying structural abnormality and assess vascular load  Start donepezil 5 mg daily, side effects discussed  Check B12, TSH Recommend good control of cardiovascular risk factors.   Continue to control mood as per PCP Increase socialization Fol[place]ow up in 1 month  Subjective:    The patient is accompanied by son who supplements the history.    How long did patient have memory difficulties?  For about 3 years worse over the last year. Patient reports some difficulty remembering new information, recent conversations, names. She likes Telenovelas. She does crosswords and sudoku  She does not participate in social events for the last 7 years, since moving from [place].  repeats oneself?  Endorsed, more frequent than before.  Disoriented when walking into a room?  Patient denies    Leaving objects in unusual places?  Denies   Wandering behavior? denies   Any personality changes, or depression, anxiety? Endorsed by her son. She had a couple of episodes of panic attacks after hip replacement during hospitalization. Son suspects situational depression due to cognitive issues and lack of social life "she does not go to [place] because of the stigma of memory loss" Hallucinations or paranoia? Denies.   Seizures? denies    Any sleep changes?  Sleeps well  Denies vivid dreams, REM behavior or sleepwalking   Sleep apnea? Denies.   Any hygiene concerns?  Denies.   Independent of bathing  and dressing? Endorsed  Does the patient need help with medications? Son is in charge because she was forgetting doses   Who is in charge of the finances? Son  is in charge     Any changes in appetite?   Decreased, watching diet due to prediabetes .      Patient have trouble swallowing?  Denies.   Does the patient cook? Yes. Des not forget her common recipes  Any headaches?  Denies.   Chronic pain? Denies.   Ambulates with difficulty?  Denies, but does not like to walk.  Recent falls or head injuries? Denies. She had hip replacement in the recent past .     Vision changes?  She reports a "dark spot on the R upper visual field", to see an ophthalmologist soon.  Any strokelike symptoms? Denies.   Any tremors? Denies.   Any anosmia? Denies.   Any incontinence of urine? Stress incontinence.  Any bowel dysfunction? Denies.      Patient lives with son and husband History of heavy alcohol intake? Denies.   History of heavy tobacco use? Denies.   Family history of dementia?   Mo and Fa with dementia.  Does patient drive? No   Retired Designer, industrial/product.  High school       No Known Allergies  Current Outpatient Medications  Medication Instructions   acetaminophen (TYLENOL) 1,000 mg, Oral, Every 6 hours PRN   azithromycin (ZITHROMAX) 250 mg, Oral, Daily, Take first 2 tablets together, then 1 every day until finished.   dextromethorphan (DELSYM) 15 mg, Oral,  2 times daily   donepezil (ARICEPT) 5 mg, Oral, Daily   enalapril (VASOTEC) 10 mg, Oral, 2 times daily   omeprazole (PRILOSEC) 20 mg, Oral, Daily     VITALS:   Vitals:   01/24/23 0806  BP: (!) 151/73  Pulse: 71  Resp: 18  SpO2: 99%  Weight: 137 lb (62.1 kg)  Height: 5\' 2"  (1.575 m)      PHYSICAL EXAM   HEENT:  Normocephalic, atraumatic.  The superficial temporal arteries are without ropiness or tenderness. Cardiovascular: Regular rate and rhythm. Lungs: Clear to auscultation bilaterally. Neck: There are no carotid bruits noted  bilaterally.  NEUROLOGICAL:    01/24/2023   10:00 AM  Montreal Cognitive Assessment   Visuospatial/ Executive (0/5) 3  Naming (0/3) 3  Attention: Read list of digits (0/2) 2  Attention: Read list of letters (0/1) 1  Attention: Serial 7 subtraction starting at 100 (0/3) 1  Language: Repeat phrase (0/2) 2  Language : Fluency (0/1) 1  Abstraction (0/2) 0  Delayed Recall (0/5) 2  Orientation (0/6) 6  Total 21  Adjusted Score (based on education) 22        No data to display           Orientation:  Alert and oriented to person, place and time. No aphasia or dysarthria. Fund of knowledge is appropriate. Recent memory impaired, remote memory normal.    Attention and concentration are reduced.  Able to name objects and repeat phrases Delayed recall  2/5 Cranial nerves: There is good facial symmetry. Extraocular muscles are intact and visual fields are full to confrontational testing. Speech is fluent and clear. No tongue deviation. Hearing is intact to conversational tone.  Tone: Tone is good throughout. Sensation: Sensation is intact to light touch.  Vibration is intact at the bilateral big toe.  Coordination: The patient has no difficulty with RAM's or FNF bilaterally. Normal finger to nose  Motor: Strength is 5/5 in the bilateral upper and lower extremities. There is no pronator drift. There are no fasciculations noted. DTR's: Deep tendon reflexes are 2/4 bilaterally. Gait and Station: The patient is able to ambulate without difficulty. Gait is cautious and narrow. Stride length is normal        Thank you for allowing [place] the opportunity to participate in the care of this nice patient. Please do not hesitate to contact us for any questions or concerns.   Total time spent on today's visit was 60 minutes dedicated to this patient today, preparing to see patient, examining the patient, ordering tests and/or medications and counseling the patient, documenting clinical information in the  EHR or other health record, independently interpreting results and communicating results to the patient/family, discussing treatment and goals, answering patient's questions and coordinating care.  Cc:  Patient, No Pcp Per  Marlowe Kays 01/24/2023 10:24 AM

## 2023-01-24 NOTE — Patient Instructions (Addendum)
It was a pleasure to see you today at our office.   Recommendations:  MRI of the brain, the radiology office will call you to arrange you appointment  [phone] at [facility]  Check labs today  [address] Start Donepezil 5 mg daily.   Follow up  Dec 26 at 9:30      For psychiatric meds, mood meds: Please have your primary care physician manage these medications.  If you have any severe symptoms of a stroke, or other severe issues such as confusion,severe chills or fever, etc call 911 or go to the ER as you may need to be evaluated further   For guidance regarding [facility] and if placement were needed at the facility, contact Social Worker tel: [phone]  For assessment of decision of mental capacity and competency:  Call Dr. Erick Blinks, geriatric psychiatrist at [phone]  Counseling regarding caregiver distress, including caregiver depression, anxiety and issues regarding community resources, adult day care programs, adult living facilities, or memory care questions:  please contact your  Primary Doctor's Social Worker   Whom to call: Memory  decline, memory medications: Call our office [phone]    [email or web address]   RECOMMENDATIONS FOR ALL PATIENTS WITH MEMORY PROBLEMS: 1. Continue to exercise (Recommend 30 minutes of walking everyday, or 3 hours every week) 2. Increase social interactions - continue going to [place] and enjoy social gatherings with friends and family 3. Eat healthy, avoid fried foods and eat more fruits and vegetables 4. Maintain adequate blood pressure, blood sugar, and blood cholesterol level. Reducing the risk of stroke and cardiovascular disease also helps promoting better memory. 5. Avoid stressful situations. Live a simple life and avoid aggravations. Organize your time and prepare for the next day in anticipation. 6. Sleep well, avoid any interruptions of sleep  and avoid any distractions in the bedroom that may interfere with adequate sleep quality 7. Avoid sugar, avoid sweets as there is a strong link between excessive sugar intake, diabetes, and cognitive impairment We discussed the Mediterranean diet, which has been shown to help patients reduce the risk of progressive memory disorders and reduces cardiovascular risk. This includes eating fish, eat fruits and green leafy vegetables, nuts like almonds and hazelnuts, walnuts, and also use olive oil. Avoid fast foods and fried foods as much as possible. Avoid sweets and sugar as sugar use has been linked to worsening of memory function.  There is always a concern of gradual progression of memory problems. If this is the case, then we may need to adjust level of care according to patient needs. Support, both to the patient and caregiver, should then be put into place.           DRIVING: Regarding driving, in patients with progressive memory problems, driving will be impaired. We advise to have someone else do the driving if trouble finding directions or if minor accidents are reported. Independent driving assessment is available to determine safety of driving.   If you are interested in the driving assessment, you can contact the following:  [facility] in [place] [phone]  [facility] [phone]  [facility] [phone]  [facility] [phone] or [phone]   FALL PRECAUTIONS: Be cautious when walking. Scan the area for obstacles that may increase the risk of trips and falls. When getting up in the mornings, sit up at the edge of the bed for a few minutes before getting out of bed. Consider elevating the bed at the head  end to avoid drop of blood pressure when getting up. Walk always in a well-lit room (use night lights in the walls). Avoid area rugs or power cords from appliances in the middle of the walkways. Use a walker or a cane if  necessary and consider physical therapy for balance exercise. Get your eyesight checked regularly.  FINANCIAL OVERSIGHT: Supervision, especially oversight when making financial decisions or transactions is also recommended.  HOME SAFETY: Consider the safety of the kitchen when operating appliances like stoves, microwave oven, and blender. Consider having supervision and share cooking responsibilities until no longer able to participate in those. Accidents with firearms and other hazards in the house should be identified and addressed as well.   ABILITY TO BE LEFT ALONE: If patient is unable to contact 911 operator, consider using LifeLine, or when the need is there, arrange for someone to stay with patients. Smoking is a fire hazard, consider supervision or cessation. Risk of wandering should be assessed by caregiver and if detected at any point, supervision and safe proof recommendations should be instituted.  MEDICATION SUPERVISION: Inability to self-administer medication needs to be constantly addressed. Implement a mechanism to ensure safe administration of the medications.      Mediterranean Diet A Mediterranean diet refers to food and lifestyle choices that are based on the traditions of countries located on the [employer]. This way of eating has been shown to help prevent certain conditions and improve outcomes for people who have chronic diseases, like kidney disease and heart disease. What are tips for following this plan? Lifestyle  Cook and eat meals together with your family, when possible. Drink enough fluid to keep your urine clear or pale yellow. Be physically active every day. This includes: Aerobic exercise like running or swimming. Leisure activities like gardening, walking, or housework. Get 7-8 hours of sleep each night. If recommended by your health care provider, drink red wine in moderation. This means 1 glass a day for nonpregnant women and 2 glasses a day for  men. A glass of wine equals 5 oz (150 mL). Reading food labels  Check the serving size of packaged foods. For foods such as rice and pasta, the serving size refers to the amount of cooked product, not dry. Check the total fat in packaged foods. Avoid foods that have saturated fat or trans fats. Check the ingredients list for added sugars, such as corn syrup. Shopping  At the grocery store, buy most of your food from the areas near the walls of the store. This includes: Fresh fruits and vegetables (produce). Grains, beans, nuts, and seeds. Some of these may be available in unpackaged forms or large amounts (in bulk). Fresh seafood. Poultry and eggs. Low-fat dairy products. Buy whole ingredients instead of prepackaged foods. Buy fresh fruits and vegetables in-season from local farmers markets. Buy frozen fruits and vegetables in resealable bags. If you do not have access to quality fresh seafood, buy precooked frozen shrimp or canned fish, such as tuna, salmon, or sardines. Buy small amounts of raw or cooked vegetables, salads, or olives from the deli or salad bar at your store. Stock your pantry so you always have certain foods on hand, such as olive oil, canned tuna, canned tomatoes, rice, pasta, and beans. Cooking  Cook foods with extra-virgin olive oil instead of using butter or other vegetable oils. Have meat as a side dish, and have vegetables or grains as your main dish. This means having meat in small portions or adding small amounts  of meat to foods like pasta or stew. Use beans or vegetables instead of meat in common dishes like chili or lasagna. Experiment with different cooking methods. Try roasting or broiling vegetables instead of steaming or sauteing them. Add frozen vegetables to soups, stews, pasta, or rice. Add nuts or seeds for added healthy fat at each meal. You can add these to yogurt, salads, or vegetable dishes. Marinate fish or vegetables using olive oil, lemon juice,  garlic, and fresh herbs. Meal planning  Plan to eat 1 vegetarian meal one day each week. Try to work up to 2 vegetarian meals, if possible. Eat seafood 2 or more times a week. Have healthy snacks readily available, such as: Vegetable sticks with hummus. Greek yogurt. Fruit and nut trail mix. Eat balanced meals throughout the week. This includes: Fruit: 2-3 servings a day Vegetables: 4-5 servings a day Low-fat dairy: 2 servings a day Fish, poultry, or lean meat: 1 serving a day Beans and legumes: 2 or more servings a week Nuts and seeds: 1-2 servings a day Whole grains: 6-8 servings a day Extra-virgin olive oil: 3-4 servings a day Limit red meat and sweets to only a few servings a month What are my food choices? Mediterranean diet Recommended Grains: Whole-grain pasta. Brown rice. Bulgar wheat. Polenta. Couscous. Whole-wheat bread. Orpah Cobb. Vegetables: Artichokes. Beets. Broccoli. Cabbage. Carrots. Eggplant. Green beans. Chard. Kale. Spinach. Onions. Leeks. Peas. Squash. Tomatoes. Peppers. Radishes. Fruits: Apples. Apricots. Avocado. Berries. Bananas. Cherries. Dates. Figs. Grapes. Lemons. Melon. Oranges. Peaches. Plums. Pomegranate. Meats and other protein foods: Beans. Almonds. Sunflower seeds. Pine nuts. Peanuts. Cod. Salmon. Scallops. Shrimp. Tuna. Tilapia. Clams. Oysters. Eggs. Dairy: Low-fat milk. Cheese. Greek yogurt. Beverages: Water. Red wine. Herbal tea. Fats and oils: Extra virgin olive oil. Avocado oil. Grape seed oil. Sweets and desserts: [place] yogurt with honey. Baked apples. Poached pears. Trail mix. Seasoning and other foods: Basil. Cilantro. Coriander. Cumin. Mint. Parsley. Sage. Rosemary. Tarragon. Garlic. Oregano. Thyme. Pepper. Balsalmic vinegar. Tahini. Hummus. Tomato sauce. Olives. Mushrooms. Limit these Grains: Prepackaged pasta or rice dishes. Prepackaged cereal with added sugar. Vegetables: Deep fried potatoes (french fries). Fruits: Fruit canned in  syrup. Meats and other protein foods: Beef. Pork. Lamb. Poultry with skin. Hot dogs. Tomasa Blase. Dairy: Ice cream. Sour cream. Whole milk. Beverages: Juice. Sugar-sweetened soft drinks. Beer. Liquor and spirits. Fats and oils: Butter. Canola oil. Vegetable oil. Beef fat (tallow). Lard. Sweets and desserts: Cookies. Cakes. Pies. Candy. Seasoning and other foods: Mayonnaise. Premade sauces and marinades. The items listed may not be a complete list. Talk with your dietitian about what dietary choices are right for you. Summary The Mediterranean diet includes both food and lifestyle choices. Eat a variety of fresh fruits and vegetables, beans, nuts, seeds, and whole grains. Limit the amount of red meat and sweets that you eat. Talk with your health care provider about whether it is safe for you to drink red wine in moderation. This means 1 glass a day for nonpregnant women and 2 glasses a day for men. A glass of wine equals 5 oz (150 mL). This information is not intended to replace advice given to you by your health care provider. Make sure you discuss any questions you have with your health care provider. Document Released: 11/02/2015 Document Revised: 12/05/2015 Document Reviewed: 11/02/2015 Elsevier Interactive Patient Education  2017 [employer].

## 2023-02-14 DIAGNOSIS — K219 Gastro-esophageal reflux disease without esophagitis: Secondary | ICD-10-CM | POA: Diagnosis not present

## 2023-02-14 DIAGNOSIS — M1990 Unspecified osteoarthritis, unspecified site: Secondary | ICD-10-CM | POA: Diagnosis not present

## 2023-02-14 DIAGNOSIS — R7303 Prediabetes: Secondary | ICD-10-CM | POA: Diagnosis not present

## 2023-02-14 DIAGNOSIS — Z9109 Other allergy status, other than to drugs and biological substances: Secondary | ICD-10-CM | POA: Diagnosis not present

## 2023-02-14 DIAGNOSIS — I10 Essential (primary) hypertension: Secondary | ICD-10-CM | POA: Diagnosis not present

## 2023-02-14 DIAGNOSIS — E7800 Pure hypercholesterolemia, unspecified: Secondary | ICD-10-CM | POA: Diagnosis not present

## 2023-02-14 DIAGNOSIS — M810 Age-related osteoporosis without current pathological fracture: Secondary | ICD-10-CM | POA: Diagnosis not present

## 2023-02-14 DIAGNOSIS — F0390 Unspecified dementia without behavioral disturbance: Secondary | ICD-10-CM | POA: Diagnosis not present

## 2023-03-06 ENCOUNTER — Telehealth: Payer: Self-pay

## 2023-03-06 NOTE — Telephone Encounter (Signed)
Patient has not done MRI, multiple attempts to contact patient and [facility] [place]

## 2023-03-20 ENCOUNTER — Encounter: Payer: Self-pay | Admitting: Physician Assistant

## 2023-03-20 ENCOUNTER — Ambulatory Visit: Payer: Medicaid Other | Admitting: Physician Assistant

## 2023-05-23 DIAGNOSIS — K219 Gastro-esophageal reflux disease without esophagitis: Secondary | ICD-10-CM | POA: Diagnosis not present

## 2023-05-23 DIAGNOSIS — M1990 Unspecified osteoarthritis, unspecified site: Secondary | ICD-10-CM | POA: Diagnosis not present

## 2023-05-23 DIAGNOSIS — Z9109 Other allergy status, other than to drugs and biological substances: Secondary | ICD-10-CM | POA: Diagnosis not present

## 2023-05-23 DIAGNOSIS — M810 Age-related osteoporosis without current pathological fracture: Secondary | ICD-10-CM | POA: Diagnosis not present

## 2023-05-23 DIAGNOSIS — E7800 Pure hypercholesterolemia, unspecified: Secondary | ICD-10-CM | POA: Diagnosis not present

## 2023-05-23 DIAGNOSIS — I10 Essential (primary) hypertension: Secondary | ICD-10-CM | POA: Diagnosis not present

## 2023-05-23 DIAGNOSIS — R7303 Prediabetes: Secondary | ICD-10-CM | POA: Diagnosis not present

## 2023-05-23 DIAGNOSIS — F0390 Unspecified dementia without behavioral disturbance: Secondary | ICD-10-CM | POA: Diagnosis not present

## 2023-05-23 DIAGNOSIS — R5383 Other fatigue: Secondary | ICD-10-CM | POA: Diagnosis not present

## 2023-05-23 DIAGNOSIS — E559 Vitamin D deficiency, unspecified: Secondary | ICD-10-CM | POA: Diagnosis not present

## 2023-05-23 DIAGNOSIS — Z131 Encounter for screening for diabetes mellitus: Secondary | ICD-10-CM | POA: Diagnosis not present

## 2023-08-28 ENCOUNTER — Encounter: Payer: Self-pay | Admitting: Physician Assistant

## 2023-08-29 ENCOUNTER — Other Ambulatory Visit

## 2023-11-12 ENCOUNTER — Emergency Department (HOSPITAL_COMMUNITY)
Admission: EM | Admit: 2023-11-12 | Discharge: 2023-11-12 | Disposition: A | Discharge status: Home / Self Care | Attending: Emergency Medicine | Admitting: Emergency Medicine

## 2023-11-12 ENCOUNTER — Emergency Department (HOSPITAL_COMMUNITY)

## 2023-11-12 ENCOUNTER — Other Ambulatory Visit (HOSPITAL_COMMUNITY): Payer: Self-pay

## 2023-11-12 ENCOUNTER — Encounter (HOSPITAL_COMMUNITY): Payer: Self-pay

## 2023-11-12 DIAGNOSIS — R051 Acute cough: Secondary | ICD-10-CM

## 2023-11-12 DIAGNOSIS — J9801 Acute bronchospasm: Secondary | ICD-10-CM | POA: Insufficient documentation

## 2023-11-12 LAB — COMPREHENSIVE METABOLIC PANEL WITH GFR
ALT: 20 U/L (ref 0–44)
AST: 26 U/L (ref 15–41)
Albumin: 3.9 g/dL (ref 3.5–5.0)
Alkaline Phosphatase: 60 U/L (ref 38–126)
Anion gap: 7 (ref 5–15)
BUN: 15 mg/dL (ref 8–23)
CO2: 26 mmol/L (ref 22–32)
Calcium: 8.9 mg/dL (ref 8.9–10.3)
Chloride: 100 mmol/L (ref 98–111)
Creatinine, Ser: 0.62 mg/dL (ref 0.44–1.00)
GFR, Estimated: >60 mL/min (ref >60)
Glucose, Bld: 102 mg/dL — ABNORMAL HIGH (ref 70–99)
Potassium: 3.9 mmol/L (ref 3.5–5.1)
Sodium: 133 mmol/L — ABNORMAL LOW (ref 135–145)
Total Bilirubin: 0.9 mg/dL (ref 0.0–1.2)
Total Protein: 7.4 g/dL (ref 6.5–8.1)

## 2023-11-12 LAB — CBC WITH DIFFERENTIAL/PLATELET
Abs Immature Granulocytes: 0.01 K/uL (ref 0.00–0.07)
Basophils Absolute: 0.1 K/uL (ref 0.0–0.1)
Basophils Relative: 1 %
Eosinophils Absolute: 0.6 K/uL — ABNORMAL HIGH (ref 0.0–0.5)
Eosinophils Relative: 12 %
HCT: 40.5 % (ref 36.0–46.0)
Hemoglobin: 12.8 g/dL (ref 12.0–15.0)
Immature Granulocytes: 0 %
Lymphocytes Relative: 25 %
Lymphs Abs: 1.3 K/uL (ref 0.7–4.0)
MCH: 26 pg (ref 26.0–34.0)
MCHC: 31.6 g/dL (ref 30.0–36.0)
MCV: 82.3 fL (ref 80.0–100.0)
Monocytes Absolute: 0.6 K/uL (ref 0.1–1.0)
Monocytes Relative: 11 %
Neutro Abs: 2.7 K/uL (ref 1.7–7.7)
Neutrophils Relative %: 51 %
Platelets: 252 K/uL (ref 150–400)
RBC: 4.92 MIL/uL (ref 3.87–5.11)
RDW: 15.4 % (ref 11.5–15.5)
WBC: 5.3 K/uL (ref 4.0–10.5)
nRBC: 0 % (ref 0.0–0.2)

## 2023-11-12 MED ORDER — IPRATROPIUM-ALBUTEROL 0.5-2.5 (3) MG/3ML IN SOLN
3.0000 mL | Freq: Once | RESPIRATORY_TRACT | Status: AC
  Administered 2023-11-12: 3 mL via RESPIRATORY_TRACT
  Filled 2023-11-12: qty 3

## 2023-11-12 MED ORDER — DOXYCYCLINE HYCLATE 100 MG PO CAPS
100.0000 mg | ORAL_CAPSULE | Freq: Two times a day (BID) | ORAL | 0 refills | Status: AC
  Filled 2023-11-12: qty 14, 7d supply, fill #0

## 2023-11-12 MED ORDER — IOHEXOL 350 MG/ML SOLN
75.0000 mL | Freq: Once | INTRAVENOUS | Status: AC | PRN
  Administered 2023-11-12: 75 mL via INTRAVENOUS

## 2023-11-12 MED ORDER — ALBUTEROL SULFATE HFA 108 (90 BASE) MCG/ACT IN AERS
2.0000 | INHALATION_SPRAY | Freq: Four times a day (QID) | RESPIRATORY_TRACT | Status: DC | PRN
  Administered 2023-11-12: 2 via RESPIRATORY_TRACT
  Filled 2023-11-12: qty 6.7

## 2023-11-12 MED ORDER — DIPHENHYDRAMINE HCL 50 MG/ML IJ SOLN
25.0000 mg | Freq: Once | INTRAMUSCULAR | Status: AC
  Administered 2023-11-12: 25 mg via INTRAVENOUS
  Filled 2023-11-12: qty 1

## 2023-11-12 MED ORDER — SODIUM CHLORIDE 0.9 % IV SOLN
500.0000 mg | Freq: Once | INTRAVENOUS | Status: DC
  Administered 2023-11-12: 500 mg via INTRAVENOUS
  Filled 2023-11-12: qty 5

## 2023-11-12 MED ORDER — AZITHROMYCIN 250 MG PO TABS
250.0000 mg | ORAL_TABLET | Freq: Every day | ORAL | 0 refills | Status: DC
  Filled 2023-11-12: qty 6, 6d supply, fill #0

## 2023-11-12 MED ORDER — ALBUTEROL SULFATE HFA 108 (90 BASE) MCG/ACT IN AERS
1.0000 | INHALATION_SPRAY | Freq: Four times a day (QID) | RESPIRATORY_TRACT | 0 refills | Status: AC | PRN
  Filled 2023-11-12: qty 18, 30d supply, fill #0

## 2023-11-12 MED ORDER — PREDNISONE 20 MG PO TABS
60.0000 mg | ORAL_TABLET | Freq: Once | ORAL | Status: AC
  Administered 2023-11-12: 60 mg via ORAL
  Filled 2023-11-12: qty 3

## 2023-11-12 MED ORDER — PREDNISONE 10 MG PO TABS
40.0000 mg | ORAL_TABLET | Freq: Every day | ORAL | 0 refills | Status: DC
  Filled 2023-11-12: qty 16, 4d supply, fill #0

## 2023-11-12 NOTE — Discharge Instructions (Addendum)
 Return for any problem.   As discussed, you have a small pulmonary nodule on CT imaging.  Your regular outpatient care provider can help monitor this nodule:  For pulmonary nodule, consider one of the  following in 3 months for both low-risk and high-risk individuals:  (a) repeat chest CT, (b) follow-up PET-CT, or (c) tissue sampling.  This recommendation follows the consensus statement: Guidelines for  Management of Incidental Pulmonary Nodules Detected on CT Images:  From the Fleischner Society 2017; Radiology 2017; 284:228-243.

## 2023-11-12 NOTE — ED Provider Notes (Signed)
 I was called to the room as the patient was pending discharge because there was concern for potential reaction.  She developed redness and warmth near the IV insertion site in her right arm.  Azithromycin  infusion was stopped.  The patient was initially given Benadryl , although there were no systemic symptoms otherwise of allergic reaction.  I later was able to clarify with the patient and her son that she has taken azithromycin  in the past.  She is a former Engineer, civil (consulting).  She feels very strongly that her reaction was due to IV infiltration.  I think this is very much a likelihood.  Therefore I have not added azithromycin  to her allergy list.  I had prescribed doxycycline  already as an alternative medication to her pharmacy, and advised that they do not need to fill both antibiotics - just one.  They are comfortable with this plan and ready for discharge   Cottie Donnice PARAS, MD 11/12/23 [phone]

## 2023-11-12 NOTE — ED Notes (Signed)
 Discharge instructions, medications, and follow up care reviewed with and provided to pt. Pt denies any further questions, and has verbalized understanding.

## 2023-11-12 NOTE — ED Triage Notes (Signed)
 Pt arrived via POV, c/o cough for several years. States has been seen for it and told it was allergies. Cough worsening this week.

## 2023-11-12 NOTE — ED Notes (Signed)
 Pt called out stating her arm was burning and itching around IV. Upon arrival noted antibiotic infusing and redness around and above site, Meds stopped, Dr Cottie notified, he assessed pt then ordered IV Benadryl  which was given. Pt continues to be observed.

## 2023-11-12 NOTE — ED Notes (Signed)
 Redness, itching and burning from ? Reaction has decreased after benadryl 

## 2023-11-12 NOTE — ED Provider Notes (Signed)
 [place] EMERGENCY DEPARTMENT AT [facility] Provider Note   CSN: [id number] Arrival date & time: 11/12/23  9163     Patient presents with: Cough   Raven Curry is a 78 y.o. female.   78 year old female with prior medical history as detailed below presents for evaluation.  Patient with longstanding history of chronic cough.  Patient reports that her cough is worsened over the last week.  Patient denies known history of pulmonary pathology.  Patient denies fever.  She denies productive cough.  She denies chest pain.  The history is provided by the patient and medical records.       Prior to Admission medications   Medication Sig Start Date End Date Taking? Authorizing Provider  acetaminophen (TYLENOL) 500 MG tablet Take 1,000 mg by mouth every 6 (six) hours as needed for mild pain or moderate pain.    [provider]  azithromycin  (ZITHROMAX ) 250 MG tablet Take 1 tablet (250 mg total) by mouth daily. Take first 2 tablets together, then 1 every day until finished. 03/01/22   Vicky Charleston, PA-C  dextromethorphan  (DELSYM ) 30 MG/5ML liquid Take 2.5 mLs (15 mg total) by mouth 2 (two) times daily. 03/01/22   Vicky Charleston, PA-C  donepezil  (ARICEPT ) 5 MG tablet Take 1 tablet (5 mg total) by mouth daily. 01/24/23   Wertman, Sara E, PA-C  enalapril (VASOTEC) 10 MG tablet Take 10 mg by mouth 2 (two) times daily.    [provider]  omeprazole  (PRILOSEC) 20 MG capsule Take 1 capsule (20 mg total) by mouth daily. 03/01/22   Vicky Charleston, PA-C    Allergies: Patient has no known allergies.    Review of Systems  All other systems reviewed and are negative.   Updated Vital Signs BP (!) 182/79 (BP Location: Right Arm)   Pulse 86   Temp 98.1 F (36.7 C) (Oral)   Resp (!) 22   SpO2 95%   Physical Exam Vitals and nursing note reviewed.  Constitutional:      General: She is not in acute distress.    Appearance: Normal appearance. She is  well-developed.  HENT:     Head: Normocephalic and atraumatic.  Eyes:     Conjunctiva/sclera: Conjunctivae normal.     Pupils: Pupils are equal, round, and reactive to light.  Cardiovascular:     Rate and Rhythm: Normal rate and regular rhythm.     Heart sounds: Normal heart sounds.  Pulmonary:     Effort: Pulmonary effort is normal. No respiratory distress.     Comments: Coarse breath sounds bilaterally. Abdominal:     General: There is no distension.     Palpations: Abdomen is soft.     Tenderness: There is no abdominal tenderness.  Musculoskeletal:        General: No deformity. Normal range of motion.     Cervical back: Normal range of motion and neck supple.  Skin:    General: Skin is warm and dry.  Neurological:     General: No focal deficit present.     Mental Status: She is alert and oriented to person, place, and time.     (all labs ordered are listed, but only abnormal results are displayed) Labs Reviewed  CBC WITH DIFFERENTIAL/PLATELET  COMPREHENSIVE METABOLIC PANEL WITH GFR    EKG: None  Radiology: DG Chest 2 View Result Date: 11/12/2023 CLINICAL DATA:  Cough EXAM: CHEST - 2 VIEW COMPARISON:  02/28/2022 FINDINGS: Airway thickening is present, suggesting bronchitis or  reactive airways disease. Atherosclerotic calcification of the aortic arch. Heart size within normal limits. Thoracic spondylosis with minimal dextroconvex thoracic scoliosis. Bony demineralization. No blunting of the costophrenic angles. IMPRESSION: 1. Airway thickening is present, suggesting bronchitis or reactive airways disease. 2. Thoracic spondylosis with minimal dextroconvex thoracic scoliosis. 3. Aortic Atherosclerosis (ICD10-I70.0). Electronically Signed   By: Ryan Salvage M.D.   On: 11/12/2023 09:00     Procedures   Medications Ordered in the ED  ipratropium-albuterol  (DUONEB) 0.5-2.5 (3) MG/3ML nebulizer solution 3 mL (has no administration in time range)                                     Medical Decision Making Patient with self-reported longstanding history of chronic cough.  Cough increased x 1 week.  Exam suggest significant bronchospasm.  Prednisone , nebulized treatments administered.  CT pulmonary shows small nodule.  No PE identified. No infiltrate seen.   On reevaluation the patient feels improved.  She desires discharge home.  She was offered admission for continued observation and treatment.  Patient understands need for close outpatient follow-up.  She understands need to have her pulmonary nodule followed by her PCP.  Strict return precautions given and understood.  Translator utilized for discharge discussion.   Amount and/or Complexity of Data Reviewed Labs: ordered. Radiology: ordered.  Risk Prescription drug management.        Final diagnoses:  Acute cough  Bronchospasm    ED Discharge Orders          Ordered    albuterol  (VENTOLIN  HFA) 108 (90 Base) MCG/ACT inhaler  Every 6 hours PRN        11/12/23 1201    predniSONE  (DELTASONE ) 10 MG tablet  Daily        11/12/23 1201    azithromycin  (ZITHROMAX ) 250 MG tablet  Daily        11/12/23 1201               Laurice Maude BROCKS, MD 11/12/23 1540

## 2023-12-08 NOTE — Progress Notes (Incomplete)
 Assessment/Plan:    Mild cognitive impairment*** Memory impairment***  Raven Curry is a very pleasant 78 y.o. RH female with a history of HTN, HLD, prediabetes, GERD, arthritis,  presenting today in follow-up for evaluation of memory loss. Patient is on donepezil  5 mg daily, tolerating well. Patient is able to participate on ADLs and to to drive without difficulties. Etiology unclear, although a component of depression is suspected.    ***.     Recommendations:   Follow up in   months. Recommend to proceed with MRI of the brain for  evaluation of potential structural and vascular abnormalities (she is et to have this done) Continue donepezil  5 mg daily. Side effects were discussed  Recommend good control of cardiovascular risk factors Increase socialization Continue to control mood as per PCP    Subjective:   This patient is accompanied in the office by her son *** who supplements the history. Previous records as well as any outside records available were reviewed prior to todays visit.   Patient was last seen on with [facility] 22/30 ***.    Any changes in memory since last visit? . Patient has some difficulty remembering recent conversations and names of people She likes to watch telenovelas, do crossword puzzles ad sudoku. She still does not socialize much since moving from [place].  repeats oneself?  Endorsed Disoriented when walking into a room?  Patient denies ***  Misplacing objects?  Patient denies   Wandering behavior?   Denies. Any personality changes since last visit? Denies.   Any worsening depression?: denies.   Hallucinations or paranoia?  Denies.   Seizures?   Denies.    Any sleep changes? Sleeps well***. Does not sleep very well***.   Denies vivid dreams, REM behavior or sleepwalking   Sleep apnea?   denies ***  Any hygiene concerns?   Denies.   Independent of bathing and dressing?  Endorsed  Does the patient needs help with medications? Son is in  charge as she may miss a dose *** Who is in charge of the finances? Son is in charge   *** Any changes in appetite?  Watches her diet due to prediabetes ***   Patient have trouble swallowing?  Denies.   Does the patient cook? Yes, denies forgetting her recipes. Any kitchen accidents such as leaving the stove on?   Denies.   Any headaches?    Denies.   Vision changes? Denies. Chronic pain?  Denies.   Ambulates with difficulty?    Denies. ***  Recent falls or head injuries?    Denies.      Unilateral weakness, numbness or tingling?  Denies.   Any tremors?  Denies.   Any anosmia?    Denies.   Any incontinence of urine?  Stress incontinence Any bowel dysfunction?  Denies.      Patient lives with son and husband.*** Does the patient drive? Yes, denies any issues***   Initial visit 01/2023 How long did patient have memory difficulties?  For about 3 years worse over the last year. Patient reports some difficulty remembering new information, recent conversations, names. She likes Telenovelas. She does crosswords and sudoku  She does not participate in social events for the last 7 years, since moving from [place].  repeats oneself?  Endorsed, more frequent than before.  Disoriented when walking into a room?  Patient denies    Leaving objects in unusual places?  Denies   Wandering behavior? denies   Any personality changes, or depression,  anxiety? Endorsed by her son. She had a couple of episodes of panic attacks after hip replacement during hospitalization. Son suspects situational depression due to cognitive issues and lack of social life she does not go to [place] because of the stigma of memory loss Hallucinations or paranoia? Denies.   Seizures? denies    Any sleep changes?  Sleeps well  Denies vivid dreams, REM behavior or sleepwalking   Sleep apnea? Denies.   Any hygiene concerns?  Denies.   Independent of bathing and dressing? Endorsed  Does the patient need help with medications?  Son is in charge because she was forgetting doses   Who is in charge of the finances? Son  is in charge     Any changes in appetite?   Decreased, watching diet due to prediabetes .      Patient have trouble swallowing?  Denies.   Does the patient cook? Yes. Des not forget her common recipes  Any headaches?  Denies.   Chronic pain? Denies.   Ambulates with difficulty?  Denies, but does not like to walk.  Recent falls or head injuries? Denies. She had hip replacement in the recent past .     Vision changes?  She reports a dark spot on the R upper visual field, to see an ophthalmologist soon.  Any strokelike symptoms? Denies.   Any tremors? Denies.   Any anosmia? Denies.   Any incontinence of urine? Stress incontinence.  Any bowel dysfunction? Denies.      Patient lives with son and husband History of heavy alcohol intake? Denies.   History of heavy tobacco use? Denies.   Family history of dementia?   Mo and Fa with dementia.  Does patient drive? No   Retired Designer, industrial/product.  High school    Past Medical History:  Diagnosis Date   History of hip surgery    Hypertension      Past Surgical History:  Procedure Laterality Date   HAND SURGERY Right    HIP SURGERY Bilateral    KNEE SURGERY Bilateral      PREVIOUS MEDICATIONS:   CURRENT MEDICATIONS:  Outpatient Encounter Medications as of 12/09/2023  Medication Sig   acetaminophen (TYLENOL) 500 MG tablet Take 1,000 mg by mouth every 6 (six) hours as needed for mild pain or moderate pain.   ADVAIR DISKUS 100-50 MCG/ACT AEPB Inhale 1 puff into the lungs 2 (two) times daily.   albuterol  (VENTOLIN  HFA) 108 (90 Base) MCG/ACT inhaler Inhale 1-2 puffs into the lungs every 6 (six) hours as needed for wheezing or shortness of breath.   alendronate (FOSAMAX) 70 MG tablet Take 70 mg by mouth once a week.   donepezil  (ARICEPT ) 5 MG tablet Take 1 tablet (5 mg total) by mouth daily. (Patient taking differently: Take 5 mg by mouth at bedtime.)    hydrochlorothiazide (MICROZIDE) 12.5 MG capsule Take 12.5 mg by mouth daily.   levocetirizine (XYZAL) 5 MG tablet Take 5 mg by mouth every other day.   losartan (COZAAR) 50 MG tablet Take 50 mg by mouth daily.   meloxicam (MOBIC) 7.5 MG tablet Take 7.5 mg by mouth daily as needed for pain.   montelukast (SINGULAIR) 10 MG tablet Take 10 mg by mouth every other day.   omeprazole  (PRILOSEC) 40 MG capsule Take 40 mg by mouth daily. (Patient not taking: Reported on 11/12/2023)   predniSONE  (DELTASONE ) 10 MG tablet Take 4 tablets (40 mg total) by mouth daily.   rosuvastatin (CRESTOR) 5 MG tablet Take  5 mg by mouth daily.   VENTOLIN  HFA 108 (90 Base) MCG/ACT inhaler Inhale 2 puffs into the lungs every 6 (six) hours as needed for wheezing or shortness of breath.   No facility-administered encounter medications on file as of 12/09/2023.     Objective:     PHYSICAL EXAMINATION:    VITALS:  There were no vitals filed for this visit.  GEN:  The patient appears stated age and is in NAD. HEENT:  Normocephalic, atraumatic.   Neurological examination:  General: NAD, well-groomed, appears stated age. Orientation: The patient is alert. Oriented to person, place and not to date.*** Cranial nerves: There is good facial symmetry.The speech is fluent and clear. No aphasia or dysarthria. Fund of knowledge is appropriate. Recent memory impaired and remote memory is normal.  Attention and concentration are normal.  Able to name objects and repeat phrases.  Hearing is intact to conversational tone ***.   Delayed recall *** Sensation: Sensation is intact to light touch throughout Motor: Strength is at least antigravity x4. DTR's 2/4 in UE/LE      01/24/2023   10:00 AM  Montreal Cognitive Assessment   Visuospatial/ Executive (0/5) 3  Naming (0/3) 3  Attention: Read list of digits (0/2) 2  Attention: Read list of letters (0/1) 1  Attention: Serial 7 subtraction starting at 100 (0/3) 1  Language: Repeat  phrase (0/2) 2  Language : Fluency (0/1) 1  Abstraction (0/2) 0  Delayed Recall (0/5) 2  Orientation (0/6) 6  Total 21  Adjusted Score (based on education) 22        No data to display             Movement examination: Tone: There is normal tone in the UE/LE Abnormal movements:  no tremor.  No myoclonus.  No asterixis.   Coordination:  There is no decremation with RAM's. Normal finger to nose  Gait and Station: The patient has no difficulty arising out of a deep-seated chair without the use of the hands. The patient's stride length is good.  Gait is cautious and narrow.   Thank you for allowing us  the opportunity to participate in the care of this nice patient. Please do not hesitate to contact us  for any questions or concerns.   Total time spent on today's visit was *** minutes dedicated to this patient today, preparing to see patient, examining the patient, ordering tests and/or medications and counseling the patient, documenting clinical information in the EHR or other health record, independently interpreting results and communicating results to the patient/family, discussing treatment and goals, answering patient's questions and coordinating care.  Cc:  Patient, No Pcp Per  Camie Sevin 12/08/2023 4:27 PM

## 2023-12-09 ENCOUNTER — Telehealth: Payer: Self-pay | Admitting: Physician Assistant

## 2023-12-09 ENCOUNTER — Ambulatory Visit: Admitting: Physician Assistant

## 2023-12-09 NOTE — Telephone Encounter (Signed)
 Pt. Needs New MRI order sent, [facility] did not contact insurance in time.

## 2023-12-09 NOTE — Telephone Encounter (Signed)
 I resent MRI order and [facility] will get authorization and contact the patient.

## 2023-12-25 ENCOUNTER — Ambulatory Visit: Admitting: Physician Assistant

## 2024-01-13 ENCOUNTER — Telehealth: Payer: Self-pay

## 2024-01-13 NOTE — Telephone Encounter (Signed)
 Multiple attempts from [facility] to contact patient for MRI, multiple messages, FYI. MRI brain wo contrast unable to be done.

## 2024-01-14 ENCOUNTER — Other Ambulatory Visit: Payer: Self-pay | Admitting: Physician Assistant

## 2024-03-24 ENCOUNTER — Ambulatory Visit: Admitting: Internal Medicine

## 2024-03-24 ENCOUNTER — Encounter: Payer: Self-pay | Admitting: Internal Medicine

## 2024-03-24 VITALS — BP 145/78 | HR 65 | Temp 97.9°F | Ht 58.75 in | Wt 132.6 lb

## 2024-03-24 DIAGNOSIS — Z23 Encounter for immunization: Secondary | ICD-10-CM

## 2024-03-24 DIAGNOSIS — R7302 Impaired glucose tolerance (oral): Secondary | ICD-10-CM | POA: Insufficient documentation

## 2024-03-24 DIAGNOSIS — Z1159 Encounter for screening for other viral diseases: Secondary | ICD-10-CM | POA: Diagnosis not present

## 2024-03-24 DIAGNOSIS — R413 Other amnesia: Secondary | ICD-10-CM | POA: Insufficient documentation

## 2024-03-24 DIAGNOSIS — I10 Essential (primary) hypertension: Secondary | ICD-10-CM | POA: Insufficient documentation

## 2024-03-24 DIAGNOSIS — E785 Hyperlipidemia, unspecified: Secondary | ICD-10-CM | POA: Insufficient documentation

## 2024-03-24 DIAGNOSIS — M810 Age-related osteoporosis without current pathological fracture: Secondary | ICD-10-CM | POA: Insufficient documentation

## 2024-03-24 DIAGNOSIS — E782 Mixed hyperlipidemia: Secondary | ICD-10-CM

## 2024-03-24 NOTE — Assessment & Plan Note (Signed)
 Check lipids today, on rosuvastatin.

## 2024-03-24 NOTE — Addendum Note (Signed)
 Addended by: CHRISTYNE IDELL LABOR on: 03/24/2024 08:50 AM   Modules accepted: Orders

## 2024-03-24 NOTE — Assessment & Plan Note (Signed)
 Uncontrolled on 2 separate measurements.  She will do ambulatory blood pressure measurements and return in 3 months for follow-up.

## 2024-03-24 NOTE — Assessment & Plan Note (Signed)
 Check TSH, vitamin B12, vitamin D.  Has follow-up with neurology in January.  Information given for CBT as she appears depressed which can certainly be affecting her memory.

## 2024-03-24 NOTE — Assessment & Plan Note (Signed)
DEXA scan requested.

## 2024-03-24 NOTE — Progress Notes (Signed)
 "   New Patient Office Visit     CC/Reason for Visit: Establish care, discuss chronic conditions Previous PCP: [facility] Last Visit: Unknown  HPI: Raven Curry is a 78 y.o. female who is coming in today for the above mentioned reasons. Past Medical History is significant for: Osteoporosis, hypertension, hyperlipidemia, impaired glucose tolerance, GERD, seasonal allergies, memory loss.  She has an appointment to follow-up with neurology for MRI in early January.  She does not smoke, drinks occasionally, no allergies her mother had Alzheimer's dementia.  She is requesting a flu vaccine.   Past Medical/Surgical History: Past Medical History:  Diagnosis Date   Cough    History of hip surgery    Hyperlipidemia    Hypertension    IGT (impaired glucose tolerance)    Memory loss    Osteoporosis    Trigger finger     Past Surgical History:  Procedure Laterality Date   HAND SURGERY Right    HIP SURGERY Bilateral    KNEE SURGERY Bilateral     Social History:  reports that she has never smoked. She has never used smokeless tobacco. She reports current alcohol use. She reports that she does not use drugs.  Allergies: Allergies[1]  Family History:  Family History  Problem Relation Age of Onset   Memory loss Mother    Memory loss Father     Current Medications[2]  Review of Systems:  Negative except as indicated in HPI.   Physical Exam: Vitals:   03/24/24 0750 03/24/24 0820  BP: (!) 140/60 (!) 145/78  Pulse: 65   Temp: 97.9 F (36.6 C)   TempSrc: Oral   SpO2: 98%   Weight: 132 lb 9.6 oz (60.1 kg)   Height: 4' 10.75 (1.492 m)    Body mass index is 27.01 kg/m.  Physical Exam Vitals reviewed.  Constitutional:      Appearance: Normal appearance.  HENT:     Head: Normocephalic and atraumatic.  Eyes:     Conjunctiva/sclera: Conjunctivae normal.  Cardiovascular:     Rate and Rhythm: Normal rate and regular rhythm.  Pulmonary:     Effort:  Pulmonary effort is normal.     Breath sounds: Normal breath sounds.  Skin:    General: Skin is warm and dry.  Neurological:     General: No focal deficit present.     Mental Status: She is alert and oriented to person, place, and time.  Psychiatric:        Mood and Affect: Mood normal.        Behavior: Behavior normal.        Thought Content: Thought content normal.        Judgment: Judgment normal.       Impression and Plan:  Mixed hyperlipidemia Assessment & Plan: Check lipids today, on rosuvastatin.  Orders: -     Lipid panel; Future  Primary hypertension Assessment & Plan: Uncontrolled on 2 separate measurements.  She will do ambulatory blood pressure measurements and return in 3 months for follow-up.  Orders: -     CBC with Differential/Platelet; Future -     Comprehensive metabolic panel with GFR; Future  Memory loss Assessment & Plan: Check TSH, vitamin B12, vitamin D.  Has follow-up with neurology in January.  Information given for CBT as she appears depressed which can certainly be affecting her memory.  Orders: -     TSH; Future -     VITAMIN D 25 Hydroxy (Vit-D Deficiency, Fractures);  Future -     Vitamin B12; Future  Age-related osteoporosis without current pathological fracture Assessment & Plan: DEXA scan requested.  Orders: -     DG Bone Density; Future  IGT (impaired glucose tolerance) Assessment & Plan: Check A1c.  Discussed lifestyle changes.  Orders: -     Hemoglobin A1c; Future  Encounter for hepatitis C screening test for low risk patient -     Hepatitis C antibody; Future  Immunization due  -Flu vaccine administered in office today.  Time spent: 46 minutes reviewing chart, interviewing and examining patient and formulating plan of care.       Tully Theophilus Andrews, MD [facility]    [1] No Known Allergies [2]  Current Outpatient Medications:    ADVAIR DISKUS 100-50 MCG/ACT AEPB, Inhale 1 puff  into the lungs 2 (two) times daily., Disp: , Rfl:    albuterol  (VENTOLIN  HFA) 108 (90 Base) MCG/ACT inhaler, Inhale 1-2 puffs into the lungs every 6 (six) hours as needed for wheezing or shortness of breath., Disp: 18 g, Rfl: 0   alendronate (FOSAMAX) 70 MG tablet, Take 70 mg by mouth once a week., Disp: , Rfl:    hydrochlorothiazide (MICROZIDE) 12.5 MG capsule, Take 12.5 mg by mouth daily., Disp: , Rfl:    levocetirizine (XYZAL) 5 MG tablet, Take 5 mg by mouth every other day., Disp: , Rfl:    losartan (COZAAR) 50 MG tablet, Take 50 mg by mouth daily., Disp: , Rfl:    meloxicam (MOBIC) 7.5 MG tablet, Take 7.5 mg by mouth daily as needed for pain., Disp: , Rfl:    omeprazole  (PRILOSEC) 40 MG capsule, Take 40 mg by mouth daily., Disp: , Rfl:    rosuvastatin (CRESTOR) 5 MG tablet, Take 5 mg by mouth daily., Disp: , Rfl:    VENTOLIN  HFA 108 (90 Base) MCG/ACT inhaler, Inhale 2 puffs into the lungs every 6 (six) hours as needed for wheezing or shortness of breath., Disp: , Rfl:   "

## 2024-03-24 NOTE — Assessment & Plan Note (Signed)
Check A1c.  Discussed lifestyle changes.

## 2024-03-26 LAB — COMPREHENSIVE METABOLIC PANEL WITH GFR
AG Ratio: 1.7 (calc) (ref 1.0–2.5)
ALT: 13 U/L (ref 6–29)
AST: 17 U/L (ref 10–35)
Albumin: 4.3 g/dL (ref 3.6–5.1)
Alkaline phosphatase (APISO): 55 U/L (ref 37–153)
BUN: 20 mg/dL (ref 7–25)
CO2: 31 mmol/L (ref 20–32)
Calcium: 9.4 mg/dL (ref 8.6–10.4)
Chloride: 103 mmol/L (ref 98–110)
Creat: 0.75 mg/dL (ref 0.60–1.00)
Globulin: 2.5 g/dL (ref 1.9–3.7)
Glucose, Bld: 102 mg/dL — ABNORMAL HIGH (ref 65–99)
Potassium: 4.3 mmol/L (ref 3.5–5.3)
Sodium: 141 mmol/L (ref 135–146)
Total Bilirubin: 0.9 mg/dL (ref 0.2–1.2)
Total Protein: 6.8 g/dL (ref 6.1–8.1)
eGFR: 82 mL/min/1.73m2

## 2024-03-26 LAB — CBC WITH DIFFERENTIAL/PLATELET
Absolute Lymphocytes: 1608 {cells}/uL (ref 850–3900)
Absolute Monocytes: 364 {cells}/uL (ref 200–950)
Basophils Absolute: 40 {cells}/uL (ref 0–200)
Basophils Relative: 1 %
Eosinophils Absolute: 284 {cells}/uL (ref 15–500)
Eosinophils Relative: 7.1 %
HCT: 41.2 % (ref 35.9–46.0)
Hemoglobin: 13.1 g/dL (ref 11.7–15.5)
MCH: 26.7 pg — ABNORMAL LOW (ref 27.0–33.0)
MCHC: 31.8 g/dL (ref 31.6–35.4)
MCV: 84.1 fL (ref 81.4–101.7)
MPV: 11 fL (ref 7.5–12.5)
Monocytes Relative: 9.1 %
Neutro Abs: 1704 {cells}/uL (ref 1500–7800)
Neutrophils Relative %: 42.6 %
Platelets: 236 Thousand/uL (ref 140–400)
RBC: 4.9 Million/uL (ref 3.80–5.10)
RDW: 14.4 % (ref 11.0–15.0)
Total Lymphocyte: 40.2 %
WBC: 4 Thousand/uL (ref 3.8–10.8)

## 2024-03-26 LAB — LIPID PANEL
Cholesterol: 162 mg/dL
HDL: 82 mg/dL
LDL Cholesterol (Calc): 66 mg/dL
Non-HDL Cholesterol (Calc): 80 mg/dL
Total CHOL/HDL Ratio: 2 (calc)
Triglycerides: 59 mg/dL

## 2024-03-26 LAB — HEMOGLOBIN A1C
Hgb A1c MFr Bld: 5.9 % — ABNORMAL HIGH
Mean Plasma Glucose: 123 mg/dL
eAG (mmol/L): 6.8 mmol/L

## 2024-03-26 LAB — TSH: TSH: 1.5 m[IU]/L (ref 0.40–4.50)

## 2024-03-26 LAB — VITAMIN D 25 HYDROXY (VIT D DEFICIENCY, FRACTURES): Vit D, 25-Hydroxy: 40 ng/mL (ref 30–100)

## 2024-03-26 LAB — VITAMIN B12: Vitamin B-12: 534 pg/mL (ref 200–1100)

## 2024-03-26 LAB — HEPATITIS C ANTIBODY: Hepatitis C Ab: NONREACTIVE

## 2024-04-08 ENCOUNTER — Ambulatory Visit: Payer: Self-pay | Admitting: Internal Medicine

## 2024-04-29 NOTE — Progress Notes (Signed)
 "   Mild Cognitive Impairment    Raven Curry is a very pleasant Spanish speaking 79 y.o. RH female with a history of with a history of hypertension, hyperlipidemia, prediabetes, GERD, arthritis, depression and presenting today in follow-up for evaluation of memory loss. Patient was on donepezil  5 mg daily until running out of the medication 4 months ago. Patient was last seen on 01/24/2023 with MoCA 22/30 then losing to follow-up.  To date, she has not had imaging performed to further evaluation of any structural abnormalities and visualization of vascular load.  Slight memory decline is noted, with MMSE 24/30, perhaps with a component of isolation, depression which may be affecting her.  Patient is able to participate on ADLs and to drive without difficulties, but spends most of her time at home, does not socialize. This patient is accompanied in the office by her son who supplements the history. Previous records as well as any outside records available were reviewed prior to todays visit   Follow up in 6-7 months Of the brain without contrast to further evaluate any structural abnormalities and vascular load Continue donepezil , increase to 10 mg daily, side effects discussed Continue to control mood as per PCP Recommend good control of cardiovascular risk factors Recommend increasing socialization Recommend counseling for situational depression     Discussed the use of AI scribe software for clinical note transcription with the patient, who gave verbal consent to proceed.  History of Present Illness   Since her last visit in November 2024, she has experienced worsening memory impairment, particularly with encoding and recalling new information, recent conversations, and names. She repeats statements more frequently and misplaces items in atypical locations. Long-term memory is also impaired, though she continues to recognize family members. She discontinued donepezil  approximately four  months ago after running out of medication.  She no longer participates in social activities and has not attended church, which she previously did regularly. She spends most of her time at home and expresses a sense of lost independence due to increased reliance on family for daily activities. She has frequent crying episodes related to her awareness of memory loss. There are no hallucinations, sleep disturbances, or nightmares reported.  She independently manages personal hygiene and continues to cook, though she sometimes forgets ingredients or alters recipes she previously insisted upon. Appetite has improved with dietary changes, and she has gained weight after prior weight loss. No dysphagia is present. She is not walking outside currently due to cold weather. There have been no recent falls or head injuries. She does not drive. She drinks alcohol infrequently and does not use tobacco.  Urinary incontinence with sneezing persists, unchanged since prior surgery. No bowel issues are reported. She has a longstanding dark spot in the right eye, unchanged and not evaluated by an ophthalmologist. No headaches, stroke symptoms, or tremors. She had a prolonged cough for three years, which is now resolved.  She was previously managed on donepezil  5 mg daily, which she tolerated well. She has a history of progressive memory impairment and personality changes over several years, with longstanding social withdrawal since immigrating from [place] eight years ago. There is a remote history of panic attacks, but none recently. Her family is involved in medication and financial management.  Initial visit 01/24/2023 How long did patient have memory difficulties?  For about 3 years worse over the last year. Patient reports some difficulty remembering new information, recent conversations, names. She likes Telenovelas. She does crosswords and sudoku  She does not participate in social events for the last 7 years,  since moving from [place].  repeats oneself?  Endorsed, more frequent than before.  Disoriented when walking into a room?  Patient denies    Leaving objects in unusual places?  Denies   Wandering behavior? denies   Any personality changes, or depression, anxiety? Endorsed by her son. She had a couple of episodes of panic attacks after hip replacement during hospitalization. Son suspects situational depression due to cognitive issues and lack of social life she does not go to [place] because of the stigma of memory loss Hallucinations or paranoia? Denies.   Seizures? denies    Any sleep changes?  Sleeps well  Denies vivid dreams, REM behavior or sleepwalking   Sleep apnea? Denies.   Any hygiene concerns?  Denies.   Independent of bathing and dressing? Endorsed  Does the patient need help with medications? Son is in charge because she was forgetting doses   Who is in charge of the finances? Son  is in charge     Any changes in appetite?   Decreased, watching diet due to prediabetes .      Patient have trouble swallowing?  Denies.   Does the patient cook? Yes. Des not forget her common recipes  Any headaches?  Denies.   Chronic pain? Denies.   Ambulates with difficulty?  Denies, but does not like to walk.  Recent falls or head injuries? Denies. She had hip replacement in the recent past .     Vision changes?  She reports a dark spot on the R upper visual field, to see an ophthalmologist soon.  Any strokelike symptoms? Denies.   Any tremors? Denies.   Any anosmia? Denies.   Any incontinence of urine? Stress incontinence.  Any bowel dysfunction? Denies.      Patient lives with son and husband History of heavy alcohol intake? Denies.   History of heavy tobacco use? Denies.   Family history of dementia?   Mo and Fa with dementia.  Does patient drive? No   Retired Designer, industrial/product.  High school         Past Medical History:  Diagnosis Date   Cough    History of hip surgery     Hyperlipidemia    Hypertension    IGT (impaired glucose tolerance)    Memory loss    Osteoporosis    Trigger finger      Past Surgical History:  Procedure Laterality Date   HAND SURGERY Right    HIP SURGERY Bilateral    KNEE SURGERY Bilateral          Objective:     PHYSICAL EXAMINATION:    VITALS:   Vitals:   04/30/24 1110  BP: (!) 163/79  Pulse: 68  Resp: 20  SpO2: 97%  Weight: 132 lb (59.9 kg)    GEN:  The patient appears stated age and is in NAD. HEENT:  Normocephalic, atraumatic.   Neurological examination:  General: NAD, well-groomed, appears stated age. Orientation: The patient is alert. Oriented to person, place and not to date.  Cranial nerves: There is good facial symmetry.tearful appearing.  The speech is fluent and clear. No aphasia or dysarthria. Fund of knowledge is appropriate. Recent and remote memory impaired.  Attention and concentration are normal.  Able to name objects and repeat phrases.  Hearing is intact to conversational tone .   Delayed recall 1/3 Sensation: Sensation is intact to light touch throughout Motor: Strength is  at least antigravity x4. DTR's 2/4 in UE/LE      01/24/2023   10:00 AM  Montreal Cognitive Assessment   Visuospatial/ Executive (0/5) 3  Naming (0/3) 3  Attention: Read list of digits (0/2) 2  Attention: Read list of letters (0/1) 1  Attention: Serial 7 subtraction starting at 100 (0/3) 1  Language: Repeat phrase (0/2) 2  Language : Fluency (0/1) 1  Abstraction (0/2) 0  Delayed Recall (0/5) 2  Orientation (0/6) 6  Total 21  Adjusted Score (based on education) 22       04/30/2024   12:00 PM  MMSE - Mini Mental State Exam  Orientation to time 4  Orientation to Place 3  Registration 3  Attention/ Calculation 4  Recall 1  Language- name 2 objects 2  Language- repeat 1  Language- follow 3 step command 3  Language- read & follow direction 1  Write a sentence 1  Copy design 1  Total score 24       Movement examination: Tone: There is normal tone in the UE/LE Abnormal movements:  no tremor.  No myoclonus.  No asterixis.   Coordination:  There is no decremation with RAM's. Normal finger to nose  Gait and Station: The patient has no difficulty arising out of a deep-seated chair without the use of the hands. The patient's stride length is good.  Gait is cautious and narrow.   Thank you for allowing us  the opportunity to participate in the care of this nice patient. Please do not hesitate to contact us  for any questions or concerns.   Total time spent on today's visit was 30 minutes dedicated to this patient today, preparing to see patient, examining the patient, ordering tests and/or medications and counseling the patient, documenting clinical information in the EHR or other health record, independently interpreting results and communicating results to the patient/family, discussing treatment and goals, answering patient's questions and coordinating care.  Cc:  Theophilus Andrews, Tully GRADE, MD  Camie Sevin 04/30/2024 12:29 PM      "

## 2024-04-30 ENCOUNTER — Ambulatory Visit: Admitting: Physician Assistant

## 2024-04-30 ENCOUNTER — Encounter: Payer: Self-pay | Admitting: Physician Assistant

## 2024-04-30 VITALS — BP 163/79 | HR 68 | Resp 20 | Wt 132.0 lb

## 2024-04-30 DIAGNOSIS — R413 Other amnesia: Secondary | ICD-10-CM

## 2024-04-30 MED ORDER — DONEPEZIL HCL 10 MG PO TABS: 10.0000 mg | ORAL_TABLET | Freq: Every day | ORAL | 4 refills | Status: AC

## 2024-04-30 NOTE — Patient Instructions (Signed)
 It was a pleasure to see you today at our office.   Recommendations:  MRI of the brain, the radiology office will call you to arrange you appointment  [phone] at [facility]  Continue donepezil  10  mg daily.   Follow up 24 de agosto a las 11:30        [email or web address]   RECOMMENDATIONS FOR ALL PATIENTS WITH MEMORY PROBLEMS: 1. Continue to exercise (Recommend 30 minutes of walking everyday, or 3 hours every week) 2. Increase social interactions - continue going to [place] and enjoy social gatherings with friends and family 3. Eat healthy, avoid fried foods and eat more fruits and vegetables 4. Maintain adequate blood pressure, blood sugar, and blood cholesterol level. Reducing the risk of stroke and cardiovascular disease also helps promoting better memory. 5. Avoid stressful situations. Live a simple life and avoid aggravations. Organize your time and prepare for the next day in anticipation. 6. Sleep well, avoid any interruptions of sleep and avoid any distractions in the bedroom that may interfere with adequate sleep quality 7. Avoid sugar, avoid sweets as there is a strong link between excessive sugar intake, diabetes, and cognitive impairment We discussed the Mediterranean diet, which has been shown to help patients reduce the risk of progressive memory disorders and reduces cardiovascular risk. This includes eating fish, eat fruits and green leafy vegetables, nuts like almonds and hazelnuts, walnuts, and also use olive oil. Avoid fast foods and fried foods as much as possible. Avoid sweets and sugar as sugar use has been linked to worsening of memory function.  There is always a concern of gradual progression of memory problems. If this is the case, then we may need to adjust level of care according to patient needs. Support, both to the patient and caregiver, should then be put into place.           DRIVING:  Regarding driving, in patients with progressive memory problems, driving will be impaired. We advise to have someone else do the driving if trouble finding directions or if minor accidents are reported. Independent driving assessment is available to determine safety of driving.   If you are interested in the driving assessment, you can contact the following:  [facility] [employer] [facility] in [place] [phone]  [facility] [phone]  [facility] [phone]  [facility] [phone] or [phone]   FALL PRECAUTIONS: Be cautious when walking. Scan the area for obstacles that may increase the risk of trips and falls. When getting up in the mornings, sit up at the edge of the bed for a few minutes before getting out of bed. Consider elevating the bed at the head end to avoid drop of blood pressure when getting up. Walk always in a well-lit room (use night lights in the walls). Avoid area rugs or power cords from appliances in the middle of the walkways. Use a walker or a cane if necessary and consider physical therapy for balance exercise. Get your eyesight checked regularly.  FINANCIAL OVERSIGHT: Supervision, especially oversight when making financial decisions or transactions is also recommended.  HOME SAFETY: Consider the safety of the kitchen when operating appliances like stoves, microwave oven, and blender. Consider having supervision and share cooking responsibilities until no longer able to participate in those. Accidents with firearms and other hazards in the house should be identified and addressed as well.   ABILITY TO BE LEFT ALONE: If patient is unable to contact 911 operator, consider using LifeLine, or when the need is there, arrange  for someone to stay with patients. Smoking is a fire hazard, consider supervision or cessation. Risk of wandering should be assessed by caregiver and if detected at any point, supervision and safe proof  recommendations should be instituted.  MEDICATION SUPERVISION: Inability to self-administer medication needs to be constantly addressed. Implement a mechanism to ensure safe administration of the medications.      Mediterranean Diet A Mediterranean diet refers to food and lifestyle choices that are based on the traditions of countries located on the [employer] [place]. This way of eating has been shown to help prevent certain conditions and improve outcomes for people who have chronic diseases, like kidney disease and heart disease. What are tips for following this plan? Lifestyle  Cook and eat meals together with your family, when possible. Drink enough fluid to keep your urine clear or pale yellow. Be physically active every day. This includes: Aerobic exercise like running or swimming. Leisure activities like gardening, walking, or housework. Get 7-8 hours of sleep each night. If recommended by your health care provider, drink red wine in moderation. This means 1 glass a day for nonpregnant women and 2 glasses a day for men. A glass of wine equals 5 oz (150 mL). Reading food labels  Check the serving size of packaged foods. For foods such as rice and pasta, the serving size refers to the amount of cooked product, not dry. Check the total fat in packaged foods. Avoid foods that have saturated fat or trans fats. Check the ingredients list for added sugars, such as corn syrup. Shopping  At the grocery store, buy most of your food from the areas near the walls of the store. This includes: Fresh fruits and vegetables (produce). Grains, beans, nuts, and seeds. Some of these may be available in unpackaged forms or large amounts (in bulk). Fresh seafood. Poultry and eggs. Low-fat dairy products. Buy whole ingredients instead of prepackaged foods. Buy fresh fruits and vegetables in-season from local farmers markets. Buy frozen fruits and vegetables in resealable bags. If you do not have  access to quality fresh seafood, buy precooked frozen shrimp or canned fish, such as tuna, salmon, or sardines. Buy small amounts of raw or cooked vegetables, salads, or olives from the deli or salad bar at your store. Stock your pantry so you always have certain foods on hand, such as olive oil, canned tuna, canned tomatoes, rice, pasta, and beans. Cooking  Cook foods with extra-virgin olive oil instead of using butter or other vegetable oils. Have meat as a side dish, and have vegetables or grains as your main dish. This means having meat in small portions or adding small amounts of meat to foods like pasta or stew. Use beans or vegetables instead of meat in common dishes like chili or lasagna. Experiment with different cooking methods. Try roasting or broiling vegetables instead of steaming or sauteing them. Add frozen vegetables to soups, stews, pasta, or rice. Add nuts or seeds for added healthy fat at each meal. You can add these to yogurt, salads, or vegetable dishes. Marinate fish or vegetables using olive oil, lemon juice, garlic, and fresh herbs. Meal planning  Plan to eat 1 vegetarian meal one day each week. Try to work up to 2 vegetarian meals, if possible. Eat seafood 2 or more times a week. Have healthy snacks readily available, such as: Vegetable sticks with hummus. Greek yogurt. Fruit and nut trail mix. Eat balanced meals throughout the week. This includes: Fruit: 2-3 servings a day Vegetables: 4-5  servings a day Low-fat dairy: 2 servings a day Fish, poultry, or lean meat: 1 serving a day Beans and legumes: 2 or more servings a week Nuts and seeds: 1-2 servings a day Whole grains: 6-8 servings a day Extra-virgin olive oil: 3-4 servings a day Limit red meat and sweets to only a few servings a month What are my food choices? Mediterranean diet Recommended Grains: Whole-grain pasta. Brown rice. Bulgar wheat. Polenta. Couscous. Whole-wheat bread. [place] Madeira. Vegetables: Artichokes. Beets. Broccoli. Cabbage. Carrots. Eggplant. Green beans. Chard. Kale. Spinach. Onions. Leeks. Peas. Squash. Tomatoes. Peppers. Radishes. Fruits: Apples. Apricots. Avocado. Berries. Bananas. Cherries. Dates. Figs. Grapes. Lemons. Melon. Oranges. Peaches. Plums. Pomegranate. Meats and other protein foods: Beans. Almonds. Sunflower seeds. Pine nuts. Peanuts. Cod. Salmon. Scallops. Shrimp. Tuna. Tilapia. Clams. Oysters. Eggs. Dairy: Low-fat milk. Cheese. Greek yogurt. Beverages: Water. Red wine. Herbal tea. Fats and oils: Extra virgin olive oil. Avocado oil. Grape seed oil. Sweets and desserts: Greek yogurt with honey. Baked apples. Poached pears. Trail mix. Seasoning and other foods: Basil. Cilantro. Coriander. Cumin. Mint. Parsley. Sage. Rosemary. Tarragon. Garlic. Oregano. Thyme. Pepper. Balsalmic vinegar. Tahini. Hummus. Tomato sauce. Olives. Mushrooms. Limit these Grains: Prepackaged pasta or rice dishes. Prepackaged cereal with added sugar. Vegetables: Deep fried potatoes (french fries). Fruits: Fruit canned in syrup. Meats and other protein foods: Beef. Pork. Lamb. Poultry with skin. Hot dogs. [place]. Dairy: Ice cream. Sour cream. Whole milk. Beverages: Juice. Sugar-sweetened soft drinks. Beer. Liquor and spirits. Fats and oils: Butter. Canola oil. Vegetable oil. Beef fat (tallow). Lard. Sweets and desserts: Cookies. Cakes. Pies. Candy. Seasoning and other foods: Mayonnaise. Premade sauces and marinades. The items listed may not be a complete list. Talk with your dietitian about what dietary choices are right for you. Summary The Mediterranean diet includes both food and lifestyle choices. Eat a variety of fresh fruits and vegetables, beans, nuts, seeds, and whole grains. Limit the amount of red meat and sweets that you eat. Talk with your health care provider about whether it is safe for you to drink red wine in moderation. This means 1 glass a day for  nonpregnant women and 2 glasses a day for men. A glass of wine equals 5 oz (150 mL). This information is not intended to replace advice given to you by your health care provider. Make sure you discuss any questions you have with your health care provider. Document Released: 11/02/2015 Document Revised: 12/05/2015 Document Reviewed: 11/02/2015 Elsevier Interactive Patient Education  2017 [employer].

## 2024-06-22 ENCOUNTER — Ambulatory Visit: Admitting: Internal Medicine

## 2024-06-30 ENCOUNTER — Ambulatory Visit: Admitting: Internal Medicine

## 2024-11-15 ENCOUNTER — Ambulatory Visit: Payer: Self-pay | Admitting: Physician Assistant
